# Patient Record
Sex: Male | Born: 1955 | Race: White | Hispanic: No | Marital: Single | Smoking: Never smoker
Health system: Southern US, Community
[De-identification: ages and names within clinical notes are randomized; demographics above are authoritative.]

## PROBLEM LIST (undated history)

## (undated) DIAGNOSIS — G10 Huntington's disease: Secondary | ICD-10-CM

## (undated) DIAGNOSIS — F32A Depression, unspecified: Secondary | ICD-10-CM

## (undated) DIAGNOSIS — F329 Major depressive disorder, single episode, unspecified: Secondary | ICD-10-CM

## (undated) HISTORY — DX: Depression, unspecified: F32.A

## (undated) HISTORY — PX: OTHER SURGICAL HISTORY: SHX169

## (undated) HISTORY — DX: Major depressive disorder, single episode, unspecified: F32.9

---

## 2017-06-25 ENCOUNTER — Emergency Department (HOSPITAL_COMMUNITY): Payer: Medicare Other

## 2017-06-25 ENCOUNTER — Encounter (HOSPITAL_COMMUNITY): Payer: Self-pay

## 2017-06-25 ENCOUNTER — Emergency Department (HOSPITAL_COMMUNITY)
Admission: EM | Admit: 2017-06-25 | Discharge: 2017-06-25 | Disposition: A | Discharge status: Skilled Nursing Facility | Payer: Medicare Other | Attending: Emergency Medicine | Admitting: Emergency Medicine

## 2017-06-25 DIAGNOSIS — R52 Pain, unspecified: Secondary | ICD-10-CM

## 2017-06-25 DIAGNOSIS — Y939 Activity, unspecified: Secondary | ICD-10-CM | POA: Diagnosis not present

## 2017-06-25 DIAGNOSIS — Y9210 Unspecified residential institution as the place of occurrence of the external cause: Secondary | ICD-10-CM | POA: Diagnosis not present

## 2017-06-25 DIAGNOSIS — W19XXXA Unspecified fall, initial encounter: Secondary | ICD-10-CM | POA: Diagnosis not present

## 2017-06-25 DIAGNOSIS — Y999 Unspecified external cause status: Secondary | ICD-10-CM | POA: Diagnosis not present

## 2017-06-25 DIAGNOSIS — M25531 Pain in right wrist: Secondary | ICD-10-CM | POA: Insufficient documentation

## 2017-06-25 MED ORDER — LORAZEPAM 2 MG/ML IJ SOLN
1.0000 mg | Freq: Once | INTRAMUSCULAR | Status: AC
  Administered 2017-06-25: 1 mg via INTRAMUSCULAR
  Filled 2017-06-25: qty 1

## 2017-06-25 NOTE — ED Notes (Signed)
[facility] called for transportation  

## 2017-06-25 NOTE — ED Triage Notes (Addendum)
Patient very combative and trying to get out bed. Nursing staff try to help patient stay in bed, but pt got more combative. Pt was held down by nursing staff including wendy,RN/ADN, charged nurse Patty, RN, Lucrezia Europeoyce, RN, primary nurse. M.d notified of patient situation. Patient was medicated with ativan.

## 2017-06-25 NOTE — ED Provider Notes (Signed)
[facility]-EMERGENCY DEPT Provider Note   CSN: [id number] Arrival date & time: 06/25/17  1821     History   Chief Complaint No chief complaint on file.   HPI Samuel Lambert is a 62 y.o. male.  HPI  Patient presents after a fall. Patient has multiple medical issues including involuntary movement, chronically. Reportedly the patient fell in a nursing facility, and since that time he has had some apparent discomfort about the right wrist. No reported pain in other areas, no reported substantial trauma, no reported loss of consciousness. Patient himself is oriented to self only, briefly pauses motion long enough to offer occasional brief single word responses to questions, level 5 caveat secondary to cognitive status. History provided by EMS providers. EMS providers note that the patient was interacting in a typical manner according to nursing home staff prior to departure.   Medical history  Involuntary movement disorder Cognitive impairment   Home Medications    Prior to Admission medications   Not on File   Social History Patient does not smoke, does not drink  Allergies   Patient has no allergy information on record.   Review of Systems Review of Systems  Unable to perform ROS: Other  Cognitive status   Physical Exam Updated Vital Signs Pulse (!) 135   SpO2 98%   Physical Exam  Constitutional: He has a sickly appearance. No distress.  Frail, sickly appearing male with substantial ongoing involuntary movement, requiring assistance from nursing staff for safety  HENT:  Head: Normocephalic and atraumatic.  Eyes: Conjunctivae and EOM are normal.  Cardiovascular: Normal rate and regular rhythm.  Pulmonary/Chest: Effort normal. No stridor. No respiratory distress.  Abdominal: He exhibits no distension.  Musculoskeletal: He exhibits no edema.  Diffuse atrophy, and no gross deformity of the right wrist, which the patient moves  freely. There is some minor erythema on the posterior surface, but no substantial tenderness to palpation  Neurological: He is alert. He displays atrophy and tremor. He exhibits abnormal muscle tone.  Skin: Skin is warm and dry.  Psychiatric: His mood appears anxious. He is withdrawn. Cognition and memory are impaired.  Nursing note and vitals reviewed.    ED Treatments / Results   Radiology Dg Wrist 2 Views Right  Result Date: 06/25/2017 CLINICAL DATA:  62 y/o  M; status post fall with wrist pain. EXAM: RIGHT WRIST - 2 VIEW COMPARISON:  None. FINDINGS: There is no evidence of fracture or dislocation. There is no evidence of arthropathy or other focal bone abnormality. Small bony density projects medial to the wrist joint, possibly sequelae of remote fracture. IMPRESSION: No acute fracture or dislocation identified. Electronically Signed   By: Mitzi HansenLance  Furusawa-Stratton M.D.   On: 06/25/2017 19:31    Procedures Procedures (including critical care time)  Medications Ordered in ED Medications  LORazepam (ATIVAN) injection 1 mg (1 mg Intramuscular Given 06/25/17 1839)     Initial Impression / Assessment and Plan / ED Course  I have reviewed the triage vital signs and the nursing notes.  Pertinent labs & imaging results that were available during my care of the patient were reviewed by me and considered in my medical decision making (see chart for details).    On repeat exam the patient is in similar condition, no evidence for new discomfort, he continues to have some involuntary motion, though less after receiving Ativan. No x-ray evidence for fracture, and reported consistency of his interactivity, from nursing facility staff, and EMS providers, and  with no hemodynamic instability, evidence for other acute new pathology, the patient was discharged back to his nursing facility.  Final Clinical Impressions(s) / ED Diagnoses   Final diagnoses:  Fall, initial encounter  Wrist pain,  right      Gerhard Munch, MD 06/25/17 1954

## 2017-06-25 NOTE — ED Notes (Signed)
Bed: [facility] Expected date:  Expected time:  Means of arrival:  Comments: fall 

## 2017-06-25 NOTE — ED Notes (Signed)
Patient unable to sign for discharge 

## 2017-06-25 NOTE — ED Notes (Signed)
Attempted to call [facility] x[facility] to give report. No answer each time.

## 2017-06-25 NOTE — ED Triage Notes (Signed)
Patient coming from [facility].coming in with c/o fall. Pt c/o right wrist pain.  Patient have hx of schizophrenia.

## 2017-06-25 NOTE — Discharge Instructions (Signed)
As discussed, your evaluation today has been largely reassuring.  But, it is important that you monitor your condition carefully, and do not hesitate to return to the ED if you develop new, or concerning changes in your condition. ? ?Otherwise, please follow-up with your physician for appropriate ongoing care. ? ?

## 2017-08-24 ENCOUNTER — Telehealth: Payer: Self-pay | Admitting: Neurology

## 2017-08-24 ENCOUNTER — Encounter: Payer: Self-pay | Admitting: Neurology

## 2017-08-24 ENCOUNTER — Ambulatory Visit: Payer: Self-pay | Admitting: Neurology

## 2017-08-24 NOTE — Telephone Encounter (Signed)
This patient canceled the same day of the new patient appointment today. 

## 2017-08-24 NOTE — Telephone Encounter (Signed)
FYI

## 2017-08-24 NOTE — Telephone Encounter (Signed)
Noted  

## 2017-08-24 NOTE — Telephone Encounter (Signed)
Samuel Lambert with [facility] [phone] transportation called said the pt is unable to come to the appt today. Angela/Manager got on the phone and said he came to them yesterday and he did not have any shoes and could not walk without shoes.  She said the social worker VM states she will be back 4/28. She said there is a [employer] store across the street and they will get him some things from there.  I did go over the no show policy and she and Samuel Lambert are aware this is the 1st no show, he cannot miss the next appt on 5/15. I did not look at when the appt was made but I see it was made on 2/26 with Samuel Lambert. This is conflicting information. FYI

## 2017-09-21 ENCOUNTER — Encounter: Payer: Self-pay | Admitting: Neurology

## 2017-09-21 ENCOUNTER — Ambulatory Visit (INDEPENDENT_AMBULATORY_CARE_PROVIDER_SITE_OTHER): Payer: Medicare Other | Admitting: Neurology

## 2017-09-21 DIAGNOSIS — G255 Other chorea: Secondary | ICD-10-CM | POA: Insufficient documentation

## 2017-09-21 DIAGNOSIS — G259 Extrapyramidal and movement disorder, unspecified: Secondary | ICD-10-CM | POA: Diagnosis not present

## 2017-09-21 MED ORDER — TETRABENAZINE 25 MG PO TABS
ORAL_TABLET | ORAL | 3 refills | Status: AC
Start: 2017-09-21 — End: ?

## 2017-09-21 NOTE — Progress Notes (Signed)
Reason for visit: Movement disorder  Referring physician: Dr. Jeryl Columbia is a 62 y.o. male  History of present illness:  Samuel Lambert is a 62 year old right-handed white male with an unclear past medical history.  The patient apparently was admitted to [facility] on 10 October 2016 [place].  The patient was noted to be septic with a Klebsiella species.  By history the patient has schizophrenia although the patient himself denies this.  He indicates that he has never been treated for schizophrenia, his brother has schizophrenia.  It is not clear what medications the patient was on prior to coming into the hospital.  During that hospitalization he was placed on Clozaril and the notes indicate an 80% improvement in his movement disorder on the medication.  A tardive movement disorder was suspected, although Huntington's disease was also considered.  At some point after discharge the Clozaril has been discontinued.  The patient comes in today with a caretaker who knows very little about the patient.  During the hospitalization the patient underwent a lumbar puncture, this was unremarkable.  CT scan evaluation of the brain was done and did not show any acute changes.  The patient indicates that he does not fall, he has no problems with swallowing, the movement disorder is quite severe affecting all 4 extremities.  The patient has been having difficulty maintaining his weight because of the constant moving.  He is on very low-dose Xenazine taking 12.5 mg daily.  He comes to this office for an evaluation.  The patient does note some occasional double vision, he denies headaches, he denies numbness or weakness of the extremities.  He has not had any problems controlling the bowels or the bladder.  He does have some cognitive dysfunction.  According to the caretaker his movement disorder has not worsened over time.  It is not clear  from the patient when the movements actually started.  Past Medical History:  Diagnosis Date  . Depression     Past Surgical History:  Procedure Laterality Date  . copd    . schizophrenia    . unspecified abnormal involuntary movements      History reviewed. No pertinent family history.  Social history:  reports that he has never smoked. He has never used smokeless tobacco. He reports that he does not drink alcohol or use drugs.  Medications:  Prior to Admission medications   Medication Sig Start Date End Date Taking? Authorizing Provider  acetaminophen (TYLENOL) 325 MG tablet Take 650 mg by mouth every 4 (four) hours as needed (for pain.).   Yes [provider]  bisacodyl (DULCOLAX) 5 MG EC tablet Take 10 mg by mouth daily as needed (for constipation).   Yes [provider]  famotidine (PEPCID) 20 MG tablet Take 20 mg by mouth 2 (two) times daily. (0800 & 2000)   Yes [provider]  tetrabenazine Samuel Lambert) 25 MG tablet One tablet daily for 2 weeks, then take one tablet twice a day 09/21/17   York Spaniel, MD     No Known Allergies  ROS:  Out of a complete 14 system review of symptoms, the patient complains only of the following symptoms, and all other reviewed systems are negative.  Joint pain, aching muscles  Pulse (!) 110, height  (1.905 m), SpO2 94 %.  Physical Exam  General: The patient is alert and cooperative at the time of the  examination.  Eyes: Pupils are equal, round, and reactive to light.  Discs could not be evaluated due to constant patient movement.  Neck: The neck is supple, no carotid bruits are noted.  Respiratory: The respiratory examination is clear.  Cardiovascular: The cardiovascular examination reveals a regular rate and rhythm, no obvious murmurs or rubs are noted.  Skin: Extremities are without significant edema.  Neurologic Exam  Mental status: The patient is alert and oriented x 3 at the time of the  examination.  Cranial nerves: Facial symmetry is present. There is good sensation of the face to pinprick and soft touch bilaterally. The strength of the facial muscles and the muscles to head turning and shoulder shrug are normal bilaterally. Speech is hesitant, monosyllabic. Extraocular movements are full. Visual fields are full. The tongue is midline, and the patient has symmetric elevation of the soft palate. No obvious hearing deficits are noted.  Motor: The motor testing reveals 5 over 5 strength of all 4 extremities. Good symmetric motor tone is noted throughout.  Sensory: Sensory testing is intact to pinprick, soft touch and vibration sensation on all 4 extremities. No evidence of extinction is noted.  Coordination: Cerebellar testing reveals that the patient is able to perform finger-nose-finger with some difficulty, choreoathetotic movements are constant, patient is unable to control the lower extremities well enough to perform heel-to-shin.  When the patient sits down the leg movements are extremely exaggerated and completely uncontrollable.  Gait and station: Gait is wide-based, slightly unsteady, interrupted with frequent choreoathetotic movements.  Romberg is negative but is unsteady  Reflexes: Deep tendon reflexes are symmetric and normal bilaterally. Toes are downgoing bilaterally.   Assessment/Plan:  1.  Severe choreoathetotic movement disorder  The patient very well may have Huntington's disease, the severity of the movement disorder is more than one would expect with a tardive disorder.  A good history of prior medications and date of onset of abnormal movements is not available.  Genetic testing would help exclude or confirm the diagnosis of Huntington's disease, but the treatment plan would not alter.  The patient is on [place], he is on a very low dose, this needs to be increased.  The patient will be placed on 25 mg daily for 2 weeks and then go to 25 mg twice daily.  We  will eventually work up to a 100 mg daily dose.  He will follow-up in 4 months.  The patient currently is residing at [facility].  Marlan Palau MD 09/21/2017 2:23 PM  [facility] [address] [place], [place] [address]  Phone [phone] Fax [phone]

## 2017-09-21 NOTE — Patient Instructions (Signed)
   Increase the Xenazine to 25 mg daily for 2 weeks, then take 1 tablet twice a day.

## 2017-10-31 ENCOUNTER — Other Ambulatory Visit: Payer: Self-pay

## 2017-10-31 ENCOUNTER — Encounter (HOSPITAL_COMMUNITY): Payer: Self-pay

## 2017-10-31 ENCOUNTER — Emergency Department (HOSPITAL_COMMUNITY): Payer: Medicare Other

## 2017-10-31 ENCOUNTER — Inpatient Hospital Stay (HOSPITAL_COMMUNITY)
Admission: EM | Admit: 2017-10-31 | Discharge: 2017-11-15 | DRG: 854 | Disposition: A | Discharge status: Home / Self Care | Payer: Medicare Other | Attending: Internal Medicine | Admitting: Internal Medicine

## 2017-10-31 ENCOUNTER — Inpatient Hospital Stay (HOSPITAL_COMMUNITY): Payer: Medicare Other

## 2017-10-31 DIAGNOSIS — E878 Other disorders of electrolyte and fluid balance, not elsewhere classified: Secondary | ICD-10-CM | POA: Diagnosis present

## 2017-10-31 DIAGNOSIS — E46 Unspecified protein-calorie malnutrition: Secondary | ICD-10-CM | POA: Diagnosis present

## 2017-10-31 DIAGNOSIS — F209 Schizophrenia, unspecified: Secondary | ICD-10-CM | POA: Diagnosis present

## 2017-10-31 DIAGNOSIS — E860 Dehydration: Secondary | ICD-10-CM | POA: Diagnosis present

## 2017-10-31 DIAGNOSIS — L03113 Cellulitis of right upper limb: Secondary | ICD-10-CM | POA: Diagnosis present

## 2017-10-31 DIAGNOSIS — J439 Emphysema, unspecified: Secondary | ICD-10-CM | POA: Diagnosis present

## 2017-10-31 DIAGNOSIS — G10 Huntington's disease: Secondary | ICD-10-CM

## 2017-10-31 DIAGNOSIS — R4182 Altered mental status, unspecified: Secondary | ICD-10-CM | POA: Diagnosis not present

## 2017-10-31 DIAGNOSIS — R21 Rash and other nonspecific skin eruption: Secondary | ICD-10-CM | POA: Diagnosis present

## 2017-10-31 DIAGNOSIS — E876 Hypokalemia: Secondary | ICD-10-CM | POA: Diagnosis present

## 2017-10-31 DIAGNOSIS — A419 Sepsis, unspecified organism: Secondary | ICD-10-CM | POA: Diagnosis present

## 2017-10-31 DIAGNOSIS — T8131XA Disruption of external operation (surgical) wound, not elsewhere classified, initial encounter: Secondary | ICD-10-CM | POA: Diagnosis not present

## 2017-10-31 DIAGNOSIS — F329 Major depressive disorder, single episode, unspecified: Secondary | ICD-10-CM | POA: Diagnosis present

## 2017-10-31 DIAGNOSIS — L02413 Cutaneous abscess of right upper limb: Secondary | ICD-10-CM | POA: Diagnosis not present

## 2017-10-31 DIAGNOSIS — M60821 Other myositis, right upper arm: Secondary | ICD-10-CM | POA: Diagnosis present

## 2017-10-31 DIAGNOSIS — E870 Hyperosmolality and hypernatremia: Secondary | ICD-10-CM | POA: Diagnosis present

## 2017-10-31 DIAGNOSIS — M7989 Other specified soft tissue disorders: Secondary | ICD-10-CM | POA: Diagnosis not present

## 2017-10-31 DIAGNOSIS — D649 Anemia, unspecified: Secondary | ICD-10-CM | POA: Diagnosis present

## 2017-10-31 DIAGNOSIS — R6520 Severe sepsis without septic shock: Secondary | ICD-10-CM | POA: Diagnosis present

## 2017-10-31 DIAGNOSIS — R1013 Epigastric pain: Secondary | ICD-10-CM | POA: Diagnosis present

## 2017-10-31 DIAGNOSIS — N179 Acute kidney failure, unspecified: Secondary | ICD-10-CM | POA: Diagnosis present

## 2017-10-31 DIAGNOSIS — Y658 Other specified misadventures during surgical and medical care: Secondary | ICD-10-CM | POA: Diagnosis not present

## 2017-10-31 DIAGNOSIS — G259 Extrapyramidal and movement disorder, unspecified: Secondary | ICD-10-CM | POA: Diagnosis present

## 2017-10-31 DIAGNOSIS — R000 Tachycardia, unspecified: Secondary | ICD-10-CM | POA: Diagnosis present

## 2017-10-31 DIAGNOSIS — R52 Pain, unspecified: Secondary | ICD-10-CM | POA: Diagnosis present

## 2017-10-31 DIAGNOSIS — R338 Other retention of urine: Secondary | ICD-10-CM | POA: Diagnosis not present

## 2017-10-31 DIAGNOSIS — M609 Myositis, unspecified: Secondary | ICD-10-CM | POA: Diagnosis present

## 2017-10-31 DIAGNOSIS — A4102 Sepsis due to Methicillin resistant Staphylococcus aureus: Secondary | ICD-10-CM | POA: Diagnosis not present

## 2017-10-31 HISTORY — DX: Huntington's disease: G10

## 2017-10-31 LAB — COMPREHENSIVE METABOLIC PANEL
ALK PHOS: 51 U/L (ref 38–126)
ALT: 23 U/L (ref 17–63)
ANION GAP: 15 (ref 5–15)
AST: 34 U/L (ref 15–41)
Albumin: 2.4 g/dL — ABNORMAL LOW (ref 3.5–5.0)
BUN: 41 mg/dL — ABNORMAL HIGH (ref 6–20)
CALCIUM: 8.5 mg/dL — AB (ref 8.9–10.3)
CO2: 22 mmol/L (ref 22–32)
Chloride: 112 mmol/L — ABNORMAL HIGH (ref 101–111)
Creatinine, Ser: 1.42 mg/dL — ABNORMAL HIGH (ref 0.61–1.24)
GFR calc non Af Amer: 52 mL/min — ABNORMAL LOW (ref >60)
Glucose, Bld: 144 mg/dL — ABNORMAL HIGH (ref 65–99)
Potassium: 3.7 mmol/L (ref 3.5–5.1)
SODIUM: 149 mmol/L — AB (ref 135–145)
TOTAL PROTEIN: 5.4 g/dL — AB (ref 6.5–8.1)
Total Bilirubin: 0.7 mg/dL (ref 0.3–1.2)

## 2017-10-31 LAB — CBC WITH DIFFERENTIAL/PLATELET
BASOS PCT: 0 %
Basophils Absolute: 0 10*3/uL (ref 0.0–0.1)
Eosinophils Absolute: 0 10*3/uL (ref 0.0–0.7)
Eosinophils Relative: 0 %
HCT: 43.5 % (ref 39.0–52.0)
HEMOGLOBIN: 14.6 g/dL (ref 13.0–17.0)
Lymphocytes Relative: 4 %
Lymphs Abs: 0.5 10*3/uL — ABNORMAL LOW (ref 0.7–4.0)
MCH: 31.5 pg (ref 26.0–34.0)
MCHC: 33.6 g/dL (ref 30.0–36.0)
MCV: 94 fL (ref 78.0–100.0)
Monocytes Absolute: 1.8 10*3/uL — ABNORMAL HIGH (ref 0.1–1.0)
Monocytes Relative: 17 %
NEUTROS ABS: 8.3 10*3/uL (ref 1.7–7.7)
Neutrophils Relative %: 79 %
Platelets: 417 10*3/uL — ABNORMAL HIGH (ref 150–400)
RBC: 4.63 MIL/uL (ref 4.22–5.81)
RDW: 14 % (ref 11.5–15.5)
WBC: 10.6 10*3/uL — ABNORMAL HIGH (ref 4.0–10.5)

## 2017-10-31 LAB — URINALYSIS, COMPLETE (UACMP) WITH MICROSCOPIC
Bilirubin Urine: NEGATIVE
GLUCOSE, UA: 50 mg/dL — AB
KETONES UR: NEGATIVE mg/dL
Leukocytes, UA: NEGATIVE
Nitrite: NEGATIVE
PROTEIN: 100 mg/dL — AB
Specific Gravity, Urine: 1.027 (ref 1.005–1.030)
pH: 5 (ref 5.0–8.0)

## 2017-10-31 LAB — I-STAT CG4 LACTIC ACID, ED
Lactic Acid, Venous: 3.43 mmol/L (ref 0.5–1.9)
Lactic Acid, Venous: 2.27 mmol/L (ref 0.5–1.9)

## 2017-10-31 MED ORDER — VANCOMYCIN HCL IN DEXTROSE 1-5 GM/200ML-% IV SOLN: 1000.0000 mg | Freq: Once | INTRAVENOUS | Status: DC

## 2017-10-31 MED ORDER — ACETAMINOPHEN 160 MG/5ML PO SOLN
650.0000 mg | Freq: Four times a day (QID) | ORAL | Status: DC | PRN
  Administered 2017-10-31: 650 mg via ORAL
  Filled 2017-10-31: qty 20.3

## 2017-10-31 MED ORDER — PIPERACILLIN-TAZOBACTAM 3.375 G IVPB 30 MIN: 3.3750 g | Freq: Once | INTRAVENOUS | Status: DC

## 2017-10-31 MED ORDER — PIPERACILLIN-TAZOBACTAM 3.375 G IVPB
3.3750 g | Freq: Three times a day (TID) | INTRAVENOUS | Status: DC
  Administered 2017-11-01 – 2017-11-02 (×3): 3.375 g via INTRAVENOUS
  Filled 2017-10-31 (×6): qty 50

## 2017-10-31 MED ORDER — LACTATED RINGERS IV BOLUS (SEPSIS)
500.0000 mL | Freq: Once | INTRAVENOUS | Status: AC
  Administered 2017-10-31: 500 mL via INTRAVENOUS

## 2017-10-31 MED ORDER — ONDANSETRON HCL 4 MG/2ML IJ SOLN: 4.0000 mg | Freq: Four times a day (QID) | INTRAMUSCULAR | Status: DC | PRN

## 2017-10-31 MED ORDER — ENOXAPARIN SODIUM 40 MG/0.4ML ~~LOC~~ SOLN
40.0000 mg | SUBCUTANEOUS | Status: DC
  Administered 2017-10-31 – 2017-11-03 (×4): 40 mg via SUBCUTANEOUS
  Filled 2017-10-31 (×8): qty 0.4

## 2017-10-31 MED ORDER — IOHEXOL 300 MG/ML  SOLN
75.0000 mL | Freq: Once | INTRAMUSCULAR | Status: AC | PRN
  Administered 2017-10-31: 75 mL via INTRAVENOUS

## 2017-10-31 MED ORDER — PIPERACILLIN-TAZOBACTAM 3.375 G IVPB 30 MIN
3.3750 g | Freq: Once | INTRAVENOUS | Status: AC
  Administered 2017-10-31: 3.375 g via INTRAVENOUS
  Filled 2017-10-31: qty 50

## 2017-10-31 MED ORDER — FAMOTIDINE 20 MG PO TABS
20.0000 mg | ORAL_TABLET | Freq: Two times a day (BID) | ORAL | Status: DC
  Filled 2017-10-31: qty 1

## 2017-10-31 MED ORDER — LACTATED RINGERS IV BOLUS (SEPSIS)
250.0000 mL | Freq: Once | INTRAVENOUS | Status: AC
  Administered 2017-10-31: 250 mL via INTRAVENOUS

## 2017-10-31 MED ORDER — LORAZEPAM 2 MG/ML IJ SOLN
1.0000 mg | Freq: Once | INTRAMUSCULAR | Status: AC
  Administered 2017-10-31: 1 mg via INTRAVENOUS
  Filled 2017-10-31: qty 1

## 2017-10-31 MED ORDER — ACETAMINOPHEN 325 MG PO TABS: 650.0000 mg | ORAL_TABLET | Freq: Four times a day (QID) | ORAL | Status: DC | PRN

## 2017-10-31 MED ORDER — ACETAMINOPHEN 650 MG RE SUPP
650.0000 mg | Freq: Once | RECTAL | Status: AC
  Administered 2017-10-31: 650 mg via RECTAL

## 2017-10-31 MED ORDER — ACETAMINOPHEN 325 MG PO TABS: 650.0000 mg | ORAL_TABLET | ORAL | Status: DC | PRN

## 2017-10-31 MED ORDER — ACETAMINOPHEN 650 MG RE SUPP
650.0000 mg | Freq: Four times a day (QID) | RECTAL | Status: DC | PRN
  Filled 2017-10-31: qty 1

## 2017-10-31 MED ORDER — BISACODYL 5 MG PO TBEC
10.0000 mg | DELAYED_RELEASE_TABLET | Freq: Every day | ORAL | Status: DC | PRN
Start: 2017-10-31 — End: 2017-11-15

## 2017-10-31 MED ORDER — FAMOTIDINE IN NACL 20-0.9 MG/50ML-% IV SOLN
20.0000 mg | Freq: Two times a day (BID) | INTRAVENOUS | Status: DC
  Administered 2017-10-31 – 2017-11-01 (×2): 20 mg via INTRAVENOUS
  Filled 2017-10-31 (×2): qty 50

## 2017-10-31 MED ORDER — ACETAMINOPHEN 325 MG PO TABS
650.0000 mg | ORAL_TABLET | Freq: Four times a day (QID) | ORAL | Status: DC | PRN
  Administered 2017-11-01: 650 mg via ORAL
  Filled 2017-10-31: qty 2

## 2017-10-31 MED ORDER — VANCOMYCIN HCL 10 G IV SOLR
1250.0000 mg | INTRAVENOUS | Status: DC
  Administered 2017-11-02: 1250 mg via INTRAVENOUS
  Filled 2017-10-31 (×2): qty 1250

## 2017-10-31 MED ORDER — PIPERACILLIN-TAZOBACTAM 3.375 G IVPB 30 MIN
3.3750 g | INTRAVENOUS | Status: AC
  Administered 2017-10-31: 3.375 g via INTRAVENOUS
  Filled 2017-10-31: qty 50

## 2017-10-31 MED ORDER — ACETAMINOPHEN 650 MG RE SUPP: 650.0000 mg | Freq: Four times a day (QID) | RECTAL | Status: DC | PRN

## 2017-10-31 MED ORDER — LACTATED RINGERS IV BOLUS (SEPSIS)
1000.0000 mL | Freq: Once | INTRAVENOUS | Status: AC
  Administered 2017-10-31: 1000 mL via INTRAVENOUS

## 2017-10-31 MED ORDER — HALOPERIDOL LACTATE 5 MG/ML IJ SOLN
2.5000 mg | Freq: Four times a day (QID) | INTRAMUSCULAR | Status: DC | PRN
  Administered 2017-10-31: 2.5 mg via INTRAVENOUS
  Filled 2017-10-31 (×2): qty 1

## 2017-10-31 MED ORDER — ACETAMINOPHEN 650 MG RE SUPP
650.0000 mg | RECTAL | Status: DC | PRN
  Filled 2017-10-31: qty 1

## 2017-10-31 MED ORDER — ACETAMINOPHEN 325 MG PO TABS
650.0000 mg | ORAL_TABLET | Freq: Once | ORAL | Status: AC
  Administered 2017-10-31: 650 mg via ORAL
  Filled 2017-10-31: qty 2

## 2017-10-31 MED ORDER — CITALOPRAM HYDROBROMIDE 20 MG PO TABS
10.0000 mg | ORAL_TABLET | Freq: Every day | ORAL | Status: DC
  Administered 2017-11-01 – 2017-11-15 (×15): 10 mg via ORAL
  Filled 2017-10-31 (×15): qty 1

## 2017-10-31 MED ORDER — VANCOMYCIN HCL IN DEXTROSE 1-5 GM/200ML-% IV SOLN
1000.0000 mg | Freq: Once | INTRAVENOUS | Status: AC
  Administered 2017-10-31: 1000 mg via INTRAVENOUS
  Filled 2017-10-31: qty 200

## 2017-10-31 MED ORDER — DEXTROSE-NACL 5-0.45 % IV SOLN
INTRAVENOUS | Status: DC
  Administered 2017-10-31 – 2017-11-07 (×13): via INTRAVENOUS

## 2017-10-31 MED ORDER — TETRABENAZINE 25 MG PO TABS
25.0000 mg | ORAL_TABLET | Freq: Two times a day (BID) | ORAL | Status: DC
  Administered 2017-11-02 – 2017-11-15 (×25): 25 mg via ORAL
  Filled 2017-10-31 (×3): qty 1

## 2017-10-31 MED ORDER — ONDANSETRON HCL 4 MG PO TABS
4.0000 mg | ORAL_TABLET | Freq: Four times a day (QID) | ORAL | Status: DC | PRN
  Filled 2017-10-31: qty 1

## 2017-10-31 NOTE — ED Notes (Signed)
Patient transported to CT 

## 2017-10-31 NOTE — Progress Notes (Signed)
Pharmacy Antibiotic Note  Samuel Lambert is a 62 y.o. male admitted on 10/31/2017 with cellulitis of RUE.  Pharmacy has been consulted for Vancomycin & Zosyn dosing.  Initial doses given in ED.   10/31/2017:  Febrile- Tm 100.68F  Mild leukocytosis, LA trending down after 1st doses antibiotics administered.   CT negative for osteo  AKI - Scr 1.42, baseline unknown  Using estimated weight of 50kg  Plan:  Zosyn 3.375gm IV Q8h to be infused over 4hrs  Vancomycin 1250mg  IV q48h  (target AUC 400-500)  Daily Scr  Monitor renal function and cx data   F/U actual weight     Temp (24hrs), Avg:100.8 F (38.2 C), Min:100.8 F (38.2 C), Max:100.8 F (38.2 C)  Recent Labs  Lab 10/31/17 1048 10/31/17 1101 10/31/17 1500  WBC 10.6*  --   --   CREATININE 1.42*  --   --   LATICACIDVEN  --  3.43* 2.27*    CrCl cannot be calculated (Unknown ideal weight.).    No Known Allergies  Antimicrobials this admission: 6/24 Vanc  >>  6/24 Zosyn >>   Dose adjustments this admission:  Microbiology results: 6/24 BCx:  6/24 UCx:    Thank you for allowing pharmacy to be a part of this patient's care.  Elson ClanLilliston, Samuel Lambert 10/31/2017 6:07 PM

## 2017-10-31 NOTE — ED Notes (Signed)
Writer checked on pt, all four sides rails are up, seizure pads are in place, pt is in the bed, and call bell at bedside.

## 2017-10-31 NOTE — ED Provider Notes (Signed)
Called to see patient after he fell out of the stretcher.  Patient has a history of Huntington's chorea and has pain all over.  Will obtain CT of head and cervical spine.  On exam he has no visible signs of deformities.  Will notify admitting team.   Lorre NickAllen, Cortez Flippen, MD 10/31/17 [phone]

## 2017-10-31 NOTE — ED Notes (Signed)
NT Precious assisted patient by feeding him lunch

## 2017-10-31 NOTE — H&P (Signed)
History and Physical    Samuel Lambert ZOX:[id number] DOB: [birth year] DOA: 10/31/2017  PCP: [name], PA   Patient coming from: SNF  Chief Complaint: Poor appetite and left arm pain  HPI: Samuel Lambert is a 62 y.o. male with medical history significant of Huntington's disease, who follows up with neurology clinic.  His movement disorder has been severe affecting his quality of life and overall health.  He is a resident from the nursing home.  Patient was brought to the hospital due to decreased p.o. intake, in the emergency department he was noted to have a right upper extremity rash.    On direct questioning patient reports severe pain of the right upper extremity, associated with pruritus, no improving or worsening factors, denies any other associated symptoms, symptoms present for last few days.  Unable to get a clear history due to patient's current cognitive dysfunction.   ED Course: Patient was diagnosed with sepsis, he received IV fluids and broad-spectrum IV antibiotics, CT scan of the right upper extremity with no deep infection.  Orthopedics was contacted with recommendations to admit and medically treat, if refractive to medical therapy may need further surgical consultation.   Review of Systems: Limited due to patient's difficulty speaking, and apparent cognitive dysfunction.  1. General: No fevers, no chills, no weight gain or weight loss 2. ENT: No runny nose or sore throat, no hearing disturbances 3. Pulmonary: No dyspnea, cough, wheezing, or hemoptysis 4. Cardiovascular: No angina, claudication, lower extremity edema, pnd or orthopnea 5. Gastrointestinal: No nausea or vomiting, no diarrhea or constipation 6. Hematology: No easy bruisability or frequent infections 7. Urology: No dysuria, hematuria or increased urinary frequency 8. Dermatology: Right upper extremity rash 9. Neurology: Persistent movement disorder 10. Musculoskeletal: Right upper extremity pain  Past  Medical History:  Diagnosis Date  . Depression   . Huntington's disease [facility])     Past Surgical History:  Procedure Laterality Date  . copd    . schizophrenia    . unspecified abnormal involuntary movements       reports that he has never smoked. He has never used smokeless tobacco. He reports that he does not drink alcohol or use drugs.  No Known Allergies  History reviewed. No pertinent family history.  Unable to give have a family history due to cognitive impairment  Prior to Admission medications   Medication Sig Start Date End Date Taking? Authorizing Provider  acetaminophen (TYLENOL) 325 MG tablet Take 650 mg by mouth every 4 (four) hours as needed (For pain.). Do not exceed 3 grams in 24 hours.   Yes [provider]  acetaminophen (TYLENOL) 500 MG tablet Take 1,000 mg by mouth 2 (two) times daily. Do not exceed 3 grams in 24 hours.   Yes [provider]  bisacodyl (DULCOLAX) 5 MG EC tablet Take 10 mg by mouth daily as needed (for constipation).   Yes [provider]  citalopram (CELEXA) 10 MG tablet Take 10 mg by mouth daily. 09/19/17  Yes [provider]  famotidine (PEPCID) 20 MG tablet Take 20 mg by mouth 2 (two) times daily.    Yes [provider]  tetrabenazine Guinevere Scarlet) 25 MG tablet One tablet daily for 2 weeks, then take one tablet twice a day Patient taking differently: Take 25 mg by mouth 2 (two) times daily.  09/21/17  Yes York Spaniel, MD    Physical Exam: Vitals:   10/31/17 1129 10/31/17 1254 10/31/17 1455 10/31/17 1500  BP: (!) 161/131 Marland Kitchen)  142/80 115/81 119/88  Pulse: (!) 117 96 (!) 103   Resp: (!) 25 20 (!) 26 (!) 28  Temp:      TempSrc:      SpO2: 99% 96% 97%     Constitutional: deconditioned and ill looking appearing  Vitals:   10/31/17 1129 10/31/17 1254 10/31/17 1455 10/31/17 1500  BP: (!) 161/131 (!) 142/80 115/81 119/88  Pulse: (!) 117 96 (!) 103   Resp: (!) 25 20 (!) 26 (!) 28  Temp:        TempSrc:      SpO2: 99% 96% 97%    Eyes: PERRL, lids, pale conjunctivae.  Head normocephalic, nose and ears with no deformities ENMT: Mucous membranes are dry.  Neck: normal, supple, no masses, no thyromegaly Respiratory: clear to auscultation bilaterally, no wheezing, no crackles. Normal respiratory effort. No accessory muscle use.  Cardiovascular: Regular rate and rhythm, no murmurs / rubs / gallops. No extremity edema. 2+ pedal pulses. No carotid bruits.  Abdomen: no tenderness, no masses palpated. No hepatosplenomegaly. Bowel sounds positive.  Musculoskeletal: no clubbing / cyanosis. No joint deformity upper and lower extremities. Good ROM, no contractures. Normal muscle tone.  Skin: diffuse papular rash on the right posterior hemithorax. Proximal lateral right upper extremity with edema, erythema and increase local temperature, dime size skin opening with serosanguinous drainage with no frank pus.   Neurologic: Persistent choreic movements.            Labs on Admission: I have personally reviewed following labs and imaging studies  CBC: Recent Labs  Lab 10/31/17 1048  WBC 10.6*  NEUTROABS 8.3  HGB 14.6  HCT 43.5  MCV 94.0  PLT 417*   Basic Metabolic Panel: Recent Labs  Lab 10/31/17 1048  NA 149*  K 3.7  CL 112*  CO2 22  GLUCOSE 144*  BUN 41*  CREATININE 1.42*  CALCIUM 8.5*   GFR: CrCl cannot be calculated (Unknown ideal weight.). Liver Function Tests: Recent Labs  Lab 10/31/17 1048  AST 34  ALT 23  ALKPHOS 51  BILITOT 0.7  PROT 5.4*  ALBUMIN 2.4*   No results for input(s): LIPASE, AMYLASE in the last 168 hours. No results for input(s): AMMONIA in the last 168 hours. Coagulation Profile: No results for input(s): INR, PROTIME in the last 168 hours. Cardiac Enzymes: No results for input(s): CKTOTAL, CKMB, CKMBINDEX, TROPONINI in the last 168 hours. BNP (last 3 results) No results for input(s): PROBNP in the last 8760 hours. HbA1C: No results  for input(s): HGBA1C in the last 72 hours. CBG: No results for input(s): GLUCAP in the last 168 hours. Lipid Profile: No results for input(s): CHOL, HDL, LDLCALC, TRIG, CHOLHDL, LDLDIRECT in the last 72 hours. Thyroid Function Tests: No results for input(s): TSH, T4TOTAL, FREET4, T3FREE, THYROIDAB in the last 72 hours. Anemia Panel: No results for input(s): VITAMINB12, FOLATE, FERRITIN, TIBC, IRON, RETICCTPCT in the last 72 hours. Urine analysis:    Component Value Date/Time   COLORURINE AMBER (A) 10/31/2017 1529   APPEARANCEUR CLOUDY (A) 10/31/2017 1529   LABSPEC 1.027 10/31/2017 1529   PHURINE 5.0 10/31/2017 1529   GLUCOSEU 50 (A) 10/31/2017 1529   HGBUR LARGE (A) 10/31/2017 1529   BILIRUBINUR NEGATIVE 10/31/2017 1529   KETONESUR NEGATIVE 10/31/2017 1529   PROTEINUR 100 (A) 10/31/2017 1529   NITRITE NEGATIVE 10/31/2017 1529   LEUKOCYTESUR NEGATIVE 10/31/2017 1529    Radiological Exams on Admission: Ct Extrem Up Entire Arm R W/cm  Result Date: 10/31/2017 CLINICAL DATA:  Right upper arm pain, swelling, and redness. History of Huntington's disease. EXAM: CT OF THE UPPER RIGHT EXTREMITY WITH CONTRAST TECHNIQUE: Multidetector CT imaging of the upper right extremity was performed according to the standard protocol following intravenous contrast administration. COMPARISON:  None. CONTRAST:  75mL OMNIPAQUE IOHEXOL 300 MG/ML  SOLN FINDINGS: Despite efforts by the technologist and patient, motion artifact is present on today's exam and could not be eliminated. This reduces exam sensitivity and specificity. Bones/Joint/Cartilage No acute fracture or dislocation. No cortical destruction or osteolysis. Mild degenerative changes of the glenohumeral joint. Osteopenia. Ligaments Suboptimally assessed by CT. Muscles and Tendons Ill-defined hypodensity within the triceps musculature. Small lipoma within the deltoid muscle. Remaining muscles are grossly unremarkable. The rotator cuff, biceps, triceps,  common extensor, common flexor tendons are grossly intact. Soft tissues Moderate circumferential soft tissue swelling about the upper arm. No focal fluid collection. Prominent right axillary lymph nodes are likely reactive. Severe emphysematous change in the right upper lobe with large bullae. No acute abnormality within the visualized abdomen and pelvis. 11 mm cystic skin lesion in the right lower back, likely a sebaceous cyst. IMPRESSION: 1. Moderate circumferential soft tissue swelling about the upper arm, nonspecific. Findings can be seen with cellulitis, venous insufficiency, or third spacing. No abscess. 2. Ill-defined hypodensity throughout the triceps musculature may reflect underlying myositis. 3. No CT evidence of osteomyelitis.  No acute osseous abnormality. 4.  Emphysema (ICD10-J43.9). Electronically Signed   By: Obie Dredge M.D.   On: 10/31/2017 14:46    EKG: Independently reviewed.  Significant artifact, sinus tachycardia.   Assessment/Plan Active Problems:   Sepsis (HCC)  62 year old male with significant history for Huntington's chorea who presents with a right upper extremity erythema, edema and tenderness.  His overall health condition is very poor related to his neurologic disease.  Apparently he does have malnutrition.  The history is limited due to his current cognitive dysfunction. On his initial physical examination his temperature was 100.8, blood pressure 128/100, heart rate 132, respiratory rate 25, oxygen saturation 96% on room air.  He had dry mucous membranes, his lungs were clear to auscultation bilaterally, heart S1-S2 present, tachycardic, the abdomen is soft nontender, no lower extremity edema.  Persistent choreic movements.  He does have an extensive rash on the right lateral upper extremity, positive signs of local pruritus.  Sodium 149, potassium 3.7, chloride 112, bicarb 22, glucose 144, BUN 41, creatinine 1.42, venous lactic acid 3.43, 2.27, white count 10.6,  hemoglobin 14.6, hematocrit 43.5, platelets 417.  Urinalysis specific gravity 1.027, 100 protein, 0-5 white cells.  EKG with significant artifact, sinus tachycardia.  Right upper extremity CT with moderate circumferential soft tissue swelling about the upper arm, suggestive cellulitis, no abscess.  Ill-defined hypodensity through the triceps musculature may reflect underlying myositis.  No CT evidence of osteomyelitis.  Patient will be admitted to the hospital with the working diagnosis of sepsis due to right upper extremity cellulitis/ myositis, complicated by acute kidney injury, hypernatremia and hyperchloremia.   1.  Sepsis due to right upper extremity cellulitis/myositis.  Patient will be admitted to the medical ward, will continue IV fluids with half-normal saline at 100 mg hour, antibiotic therapy with IV vancomycin and IV Zosyn.  Will follow-up on cultures, cell count and temperature curve. His choreic movements will affect telemetry monitoring.   2.  Acute kidney injury complicated by hypernatremia and hyperchloremia.  Prerenal acute kidney injury due to sepsis and dehydration.  Will continue fluid resuscitation with half normal saline and  dextrose at 100 mL/h.  Follow-up kidney function in the morning, avoid hypotension nephrotoxic agents.  Vancomycin dosing per pharmacy protocol..  3.  Huntington's chorea.  Patient in a overall poor health condition, likely he does have a very low functional capacity.  Will continue supportive medical therapy.  The caregivers need to be contacted for further advanced directives.  Continue citalopram and to tetrabenazine.   4.  Dyspepsia.  Will continue famotidine.    DVT prophylaxis: enoxaparin  Code Status:  full  Family Communication: no family at the bedside   Disposition Plan: pending clinical response  / med surg Consults called: none  Admission status: Inpatient.     Geanie Pacifico Annett Gulaaniel Dekota Shenk MD [facility] Pager [phone]  If  7PM-7AM, please contact night-coverage [email or web address] Password [email or web address]  10/31/2017, 5:12 PM

## 2017-10-31 NOTE — ED Notes (Signed)
This Clinical research associatewriter spoke with K.Scholl MD regarding the patient's condition. MD reviewed the CT scans and stated patient was safe to be transported to the 6th floor, and stated she would put in an order for a safety sitter for the patient.

## 2017-10-31 NOTE — ED Notes (Signed)
This nurse called to patient bedside by NT. Patient was lying on the floor naked, on his back, both side rails remain up and with seizure pads in place. Patient experiencing involuntary movements due to Huntingtons Disease. Patient's head and neck secured in a c-collar, and Dr Freida BusmanAllen called to bedside. Patient lifted onto bed using EMS backboard, and side rails with seizure precautions put in place. Patient Vital signs stable. Dr Freida BusmanAllen examined patient - patient complaining of increased pain to head and neck. CT scans ordered. Hospitalist Arrien paged using Amion. Awaiting response.

## 2017-10-31 NOTE — ED Notes (Signed)
Bed: WA06 Expected date:  Expected time:  Means of arrival:  Comments: EMS-FTT

## 2017-10-31 NOTE — ED Provider Notes (Signed)
[facility]-EMERGENCY DEPT Provider Note   CSN: [id number] Arrival date & time: 10/31/17  1017     History   Chief Complaint Chief Complaint  Patient presents with  . Decreased PO intake  . Wound Infection    HPI Samuel Lambert is a 62 y.o. male.  62 year old male with past medical history including Huntington's disease who presents with decreased oral intake.  The patient was brought in by EMS after his nursing facility called for decreased oral intake since yesterday.  Nursing staff also noted that he has been moving/jerking more than usual. EMS noted R arm redness and swelling but nursing facility did not mention this. PT asked for juice in route.  LEVEL 5 CAVEAT DUE TO AMS  The history is provided by the EMS personnel and the nursing home.    Past Medical History:  Diagnosis Date  . Depression   . Huntington's disease [facility](HCC)     Patient Active Problem List   Diagnosis Date Noted  . Choreoathetosis 09/21/2017    Past Surgical History:  Procedure Laterality Date  . copd    . schizophrenia    . unspecified abnormal involuntary movements          Home Medications    Prior to Admission medications   Medication Sig Start Date End Date Taking? Authorizing Provider  acetaminophen (TYLENOL) 325 MG tablet Take 650 mg by mouth every 4 (four) hours as needed (For pain.). Do not exceed 3 grams in 24 hours.   Yes [provider]  acetaminophen (TYLENOL) 500 MG tablet Take 1,000 mg by mouth 2 (two) times daily. Do not exceed 3 grams in 24 hours.   Yes [provider]  bisacodyl (DULCOLAX) 5 MG EC tablet Take 10 mg by mouth daily as needed (for constipation).   Yes [provider]  citalopram (CELEXA) 10 MG tablet Take 10 mg by mouth daily. 09/19/17  Yes [provider]  famotidine (PEPCID) 20 MG tablet Take 20 mg by mouth 2 (two) times daily.    Yes [provider]  tetrabenazine Guinevere Scarlet(XENAZINE) 25 MG tablet One  tablet daily for 2 weeks, then take one tablet twice a day Patient taking differently: Take 25 mg by mouth 2 (two) times daily.  09/21/17  Yes York SpanielWillis, Charles K, MD    Family History No family history on file.  Social History Social History   Tobacco Use  . Smoking status: Never Smoker  . Smokeless tobacco: Never Used  Substance Use Topics  . Alcohol use: Never    Frequency: Never  . Drug use: Never     Allergies   Patient has no known allergies.   Review of Systems Review of Systems  Unable to perform ROS: Mental status change     Physical Exam Updated Vital Signs BP 119/88   Pulse (!) 103   Temp (!) 100.8 F (38.2 C) (Oral)   Resp (!) 28   SpO2 97%   Physical Exam  Constitutional: He is oriented to person, place, and time. No distress.  Cachectic, chronically ill-appearing, continually moving in bed with chorea   HENT:  Head: Normocephalic and atraumatic.  dry mucous membranes  Eyes: Conjunctivae are normal.  Neck: Neck supple.  Cardiovascular: Regular rhythm and normal heart sounds. Tachycardia present.  No murmur heard. Pulmonary/Chest: Effort normal and breath sounds normal.  Abdominal: Soft. Bowel sounds are normal. He exhibits no distension. There is no tenderness.  Musculoskeletal: He exhibits edema.  Erythema, warmth, and  induration of upper R arm, no crepitus  Neurological: He is alert and oriented to person, place, and time.  States "help me" but unable to answer questions; contiuous choreoform movements  Skin: Skin is warm and dry. There is erythema.  Erythema, induration, and warmth of upper R arm with ulcerative wound draining pus on lateral proximal arm near deltoid muscle  Nursing note and vitals reviewed.    ED Treatments / Results  Labs (all labs ordered are listed, but only abnormal results are displayed) Labs Reviewed  COMPREHENSIVE METABOLIC PANEL - Abnormal; Notable for the following components:      Result Value   Sodium 149 (*)     Chloride 112 (*)    Glucose, Bld 144 (*)    BUN 41 (*)    Creatinine, Ser 1.42 (*)    Calcium 8.5 (*)    Total Protein 5.4 (*)    Albumin 2.4 (*)    GFR calc non Af Amer 52 (*)    All other components within normal limits  CBC WITH DIFFERENTIAL/PLATELET - Abnormal; Notable for the following components:   WBC 10.6 (*)    Platelets 417 (*)    Lymphs Abs 0.5 (*)    Monocytes Absolute 1.8 (*)    All other components within normal limits  I-STAT CG4 LACTIC ACID, ED - Abnormal; Notable for the following components:   Lactic Acid, Venous 3.43 (*)    All other components within normal limits  I-STAT CG4 LACTIC ACID, ED - Abnormal; Notable for the following components:   Lactic Acid, Venous 2.27 (*)    All other components within normal limits  CULTURE, BLOOD (ROUTINE X 2)  CULTURE, BLOOD (ROUTINE X 2)  URINE CULTURE  URINALYSIS, ROUTINE W REFLEX MICROSCOPIC  URINALYSIS, COMPLETE (UACMP) WITH MICROSCOPIC    EKG EKG Interpretation  Date/Time:  Monday October 31 2017 11:36:25 EDT Ventricular Rate:  135 PR Interval:    QRS Duration: 80 QT Interval:  315 QTC Calculation: 473 R Axis:   0 Text Interpretation:  Poor quality data, interpretation may be affected Sinus tachycardia Multiform ventricular premature complexes LAE, consider biatrial enlargement Indeterminate axis Low voltage, precordial leads Abnormal R-wave progression, early transition UNABLE TO READ Repol abnrm, prob ischemia, anterolateral lds Minimal ST elevation, anterolateral leads Artifact in lead(s) I II III aVR aVL aVF V1 V2 V3 V4 V5 V6 and baseline wander in lead(s) I II III aVL aVF V1 V2 V3 V4 V5 V6 Confirmed by Frederick Peers [phone]) on 10/31/2017 11:41:11 AM   Radiology Ct Extrem Up Entire Arm R W/cm  Result Date: 10/31/2017 CLINICAL DATA:  Right upper arm pain, swelling, and redness. History of Huntington's disease. EXAM: CT OF THE UPPER RIGHT EXTREMITY WITH CONTRAST TECHNIQUE: Multidetector CT imaging of the upper  right extremity was performed according to the standard protocol following intravenous contrast administration. COMPARISON:  None. CONTRAST:  75mL OMNIPAQUE IOHEXOL 300 MG/ML  SOLN FINDINGS: Despite efforts by the technologist and patient, motion artifact is present on today's exam and could not be eliminated. This reduces exam sensitivity and specificity. Bones/Joint/Cartilage No acute fracture or dislocation. No cortical destruction or osteolysis. Mild degenerative changes of the glenohumeral joint. Osteopenia. Ligaments Suboptimally assessed by CT. Muscles and Tendons Ill-defined hypodensity within the triceps musculature. Small lipoma within the deltoid muscle. Remaining muscles are grossly unremarkable. The rotator cuff, biceps, triceps, common extensor, common flexor tendons are grossly intact. Soft tissues Moderate circumferential soft tissue swelling about the upper arm. No focal fluid collection.  Prominent right axillary lymph nodes are likely reactive. Severe emphysematous change in the right upper lobe with large bullae. No acute abnormality within the visualized abdomen and pelvis. 11 mm cystic skin lesion in the right lower back, likely a sebaceous cyst. IMPRESSION: 1. Moderate circumferential soft tissue swelling about the upper arm, nonspecific. Findings can be seen with cellulitis, venous insufficiency, or third spacing. No abscess. 2. Ill-defined hypodensity throughout the triceps musculature may reflect underlying myositis. 3. No CT evidence of osteomyelitis.  No acute osseous abnormality. 4.  Emphysema (ICD10-J43.9). Electronically Signed   By: Obie Dredge M.D.   On: 10/31/2017 14:46    Procedures .Critical Care Performed by: Laurence Spates, MD Authorized by: Laurence Spates, MD   Critical care provider statement:    Critical care time (minutes):  35   Critical care time was exclusive of:  Separately billable procedures and treating other patients   Critical care was  necessary to treat or prevent imminent or life-threatening deterioration of the following conditions:  Sepsis   Critical care was time spent personally by me on the following activities:  Blood draw for specimens, development of treatment plan with patient or surrogate, evaluation of patient's response to treatment, examination of patient, obtaining history from patient or surrogate, ordering and performing treatments and interventions, ordering and review of laboratory studies, ordering and review of radiographic studies and re-evaluation of patient's condition   (including critical care time)  Medications Ordered in ED Medications  lactated ringers bolus 1,000 mL (0 mLs Intravenous Stopped 10/31/17 1152)    And  lactated ringers bolus 500 mL (0 mLs Intravenous Stopped 10/31/17 1516)    And  lactated ringers bolus 250 mL (0 mLs Intravenous Stopped 10/31/17 1159)  piperacillin-tazobactam (ZOSYN) IVPB 3.375 g (0 g Intravenous Stopped 10/31/17 1152)  vancomycin (VANCOCIN) IVPB 1000 mg/200 mL premix (0 mg Intravenous Stopped 10/31/17 1220)  acetaminophen (TYLENOL) tablet 650 mg (650 mg Oral Given 10/31/17 1121)  LORazepam (ATIVAN) injection 1 mg (1 mg Intravenous Given 10/31/17 1352)  iohexol (OMNIPAQUE) 300 MG/ML solution 75 mL (75 mLs Intravenous Contrast Given 10/31/17 1352)     Initial Impression / Assessment and Plan / ED Course  I have reviewed the triage vital signs and the nursing notes.  Pertinent labs & imaging results that were available during my care of the patient were reviewed by me and considered in my medical decision making (see chart for details).     PT ill-appearing, tachycardic, and febrile on exam.  Concerning right arm infection.  Initiated code sepsis with blood and urine cultures, IV antibiotics, IV fluids.  Initial lactate of 3.43 improved to 2.27 after fluids.  Labs show sodium 149, chloride 112, creatinine 1.42, 0.6.  Plain films show no gas in soft tissues.  I am  concerned about a deep space infection such as abscess or myositis therefore obtained CT.  CT shows findings suggestive of cellulitis, also ill-defined hypodensity in triceps muscle, possible myositis.  No evidence of osteomyelitis.  Contacted ortho, [name], who recommended continuing IV abx for now and monitoring for improvement. Ortho is available if he has clinical decline suggestive of worsening infection.  Discussed admission with [facility], Dr. Ella Jubilee, and pt admitted for further care.  Final Clinical Impressions(s) / ED Diagnoses   Final diagnoses:  None    ED Discharge Orders    None       Little, Ambrose Finland, MD 10/31/17 1711

## 2017-10-31 NOTE — Progress Notes (Signed)
Pharmacy Note   A consult was received from an ED physician for vancomycin and Zosyn per pharmacy dosing.    The patient's profile has been reviewed for ht/wt/allergies/indication/available labs.    A one time order has been placed for vancomycin 1000 mg IV x1 and Zosyn 3.375 gr IV x1 .  Further antibiotics/pharmacy consults should be ordered by admitting physician if indicated.                       Thank you,  Adalberto ColeNikola Cicley Ganesh, PharmD, BCPS Pager [phone] 10/31/2017 10:52 AM

## 2017-10-31 NOTE — ED Notes (Signed)
Dr Ilda FoilArrian called this nurse. This nurse reported the events of the fall to the MD. IV Ativan ordered with verbal order for CT scan. Will continue to monitor

## 2017-10-31 NOTE — ED Triage Notes (Signed)
Patient BIB EMS from [facility] with complaints of decreased oral intake starting yesterday. Per EMS report, ever since he was picked up by EMS, he has been asking for juice. Patient has history of Huntingtons Disease, and nursing home staff reported to EMS the patient has been "more active than normal." EMS reported noting a wound on patient right forearm. Patient Alert and talking in triage, asking for juice.

## 2017-11-01 LAB — BASIC METABOLIC PANEL
Anion gap: 8 (ref 5–15)
BUN: 40 mg/dL — ABNORMAL HIGH (ref 6–20)
CHLORIDE: 113 mmol/L — AB (ref 98–111)
CO2: 27 mmol/L (ref 22–32)
CREATININE: 1.09 mg/dL (ref 0.61–1.24)
Calcium: 8.6 mg/dL — ABNORMAL LOW (ref 8.9–10.3)
GFR calc Af Amer: >60 mL/min (ref >60)
GFR calc non Af Amer: >60 mL/min (ref >60)
Glucose, Bld: 218 mg/dL — ABNORMAL HIGH (ref 70–99)
POTASSIUM: 3.7 mmol/L (ref 3.5–5.1)
Sodium: 148 mmol/L — ABNORMAL HIGH (ref 135–145)

## 2017-11-01 LAB — CBC
HEMATOCRIT: 44.1 % (ref 39.0–52.0)
HEMOGLOBIN: 14 g/dL (ref 13.0–17.0)
MCH: 29.2 pg (ref 26.0–34.0)
MCHC: 31.7 g/dL (ref 30.0–36.0)
MCV: 91.9 fL (ref 78.0–100.0)
Platelets: 225 10*3/uL (ref 150–400)
RBC: 4.8 MIL/uL (ref 4.22–5.81)
RDW: 14.1 % (ref 11.5–15.5)
WBC: 6.2 10*3/uL (ref 4.0–10.5)

## 2017-11-01 LAB — MRSA PCR SCREENING: MRSA by PCR: POSITIVE — AB

## 2017-11-01 LAB — URINE CULTURE
CULTURE: NO GROWTH
SPECIAL REQUESTS: NORMAL

## 2017-11-01 MED ORDER — MUSCLE RUB 10-15 % EX CREA: TOPICAL_CREAM | CUTANEOUS | Status: DC | PRN

## 2017-11-01 MED ORDER — ENSURE ENLIVE PO LIQD
237.0000 mL | Freq: Three times a day (TID) | ORAL | Status: DC
  Administered 2017-11-01 – 2017-11-14 (×26): 237 mL via ORAL

## 2017-11-01 MED ORDER — ADULT MULTIVITAMIN W/MINERALS CH
1.0000 | ORAL_TABLET | Freq: Every day | ORAL | Status: DC
  Administered 2017-11-01 – 2017-11-15 (×15): 1 via ORAL
  Filled 2017-11-01 (×15): qty 1

## 2017-11-01 MED ORDER — IBUPROFEN 200 MG PO TABS
400.0000 mg | ORAL_TABLET | Freq: Four times a day (QID) | ORAL | Status: DC | PRN
  Administered 2017-11-01 – 2017-11-04 (×3): 400 mg via ORAL
  Filled 2017-11-01 (×3): qty 2

## 2017-11-01 MED ORDER — PANTOPRAZOLE SODIUM 40 MG PO TBEC
40.0000 mg | DELAYED_RELEASE_TABLET | Freq: Every day | ORAL | Status: DC
  Administered 2017-11-01 – 2017-11-15 (×15): 40 mg via ORAL
  Filled 2017-11-01 (×15): qty 1

## 2017-11-01 MED ORDER — MORPHINE SULFATE (PF) 2 MG/ML IV SOLN
1.0000 mg | INTRAVENOUS | Status: DC | PRN
  Administered 2017-11-02 – 2017-11-10 (×17): 1 mg via INTRAVENOUS
  Filled 2017-11-01 (×17): qty 1

## 2017-11-01 MED ORDER — HALOPERIDOL LACTATE 5 MG/ML IJ SOLN
1.0000 mg | Freq: Four times a day (QID) | INTRAMUSCULAR | Status: DC | PRN
  Administered 2017-11-07 – 2017-11-08 (×2): 1 mg via INTRAMUSCULAR
  Filled 2017-11-01 (×2): qty 1

## 2017-11-01 MED ORDER — HALOPERIDOL LACTATE 2 MG/ML PO CONC
1.0000 mg | Freq: Four times a day (QID) | ORAL | Status: DC | PRN
  Administered 2017-11-07: 1 mg via ORAL
  Filled 2017-11-01 (×2): qty 0.5

## 2017-11-01 NOTE — Progress Notes (Signed)
Initial Nutrition Assessment  DOCUMENTATION CODES:   Underweight  INTERVENTION:  - Will order Ensure Enlive TID, each supplement provides 350 kcal and 20 grams of protein. - Will order Magic Cup BID with meals, each supplement provides 290 kcal and 9 grams of protein. - Will order daily multivitamin with minerals.  - Sitter/tech/RN to assist with meals. - Continue to encourage PO intakes.    NUTRITION DIAGNOSIS:   Inadequate oral intake related to poor appetite as evidenced by other (comment)(report from sitter at bedside).  GOAL:   Patient will meet greater than or equal to 90% of their needs  MONITOR:   PO intake, Supplement acceptance, Weight trends, Labs  REASON FOR ASSESSMENT:   Malnutrition Screening Tool  ASSESSMENT:   62 y.o. male with medical history significant of Huntington's disease, who follows up with neurology clinic.  His movement disorder has been severe affecting his quality of life and overall health.  He is a resident from the nursing home.  Patient was brought to the hospital due to decreased p.o. intake, in the emergency department he was noted to have a right upper extremity rash.    BMI indicates underweight status. Per flow sheet, patient consumed a cup of ice cream and a bite each of grits and eggs for breakfast this AM. Patient unable to give much information at this time, does best with conversation that only requires 1-2 word response. Sitter at bedside provides all information. Patient consumed 100% of a cup of ice cream and a cup of applesauce for lunch. He did not have any difficulty or coughing with swallowing. He has been drinking liquids throughout the day and seems to prefer those over solid foods. Sitter reports that patient drank all of an Ensure yesterday and part of one today.  NFPE deferred given hx of Huntington's disease. Patient is very slender, has visually apparent wasting throughout upper and lower body but unable to determine the  extent of this that is d/t Huntington's disease or that is due to other factors. No previous weight hx available and patient unable to provide this information. Unable to obtain information about intakes PTA other than patient noting that he did not like the food at the facility.  Medications reviewed; 20 mg IV Pepcid BID. Labs reviewed; Na: 148 mmol/L, Cl: 113 mmol/L, BUN: 40 mg/dL, Ca: 8.6 mg/dL.  IVF: D5-1/2 NS @ 100 ml/hr (408 kcal).       NUTRITION - FOCUSED PHYSICAL EXAM:  Did not complete given hx of Huntington's disease.  Diet Order:   Diet Order           Diet regular Room service appropriate? Yes; Fluid consistency: Thin  Diet effective now          EDUCATION NEEDS:   No education needs have been identified at this time  Skin:  Skin Assessment: Skin Integrity Issues: Skin Integrity Issues:: Other (Comment) Other: R arm non-pressure injury  Last BM:  PTA/unknown  Height:   Ht Readings from Last 1 Encounters:  11/01/17 6\' 3"  (1.905 m)    Weight:   Wt Readings from Last 1 Encounters:  11/01/17 145 lb (65.8 kg)    Ideal Body Weight:  89.09 kg  BMI:  Body mass index is 18.12 kg/m.  Estimated Nutritional Needs:   Kcal:  1775-1975 (27-30 kcal/kg)  Protein:  75-85 grams  Fluid:  >/= 1.8 L/day     Trenton GammonJessica Denham Mose, MS, RD, LDN, CNSC Inpatient Clinical Dietitian Pager # [phone] After hours/weekend pager #  [phone]  

## 2017-11-01 NOTE — Clinical Social Work Note (Signed)
Clinical Social Work Assessment  Patient Details  Name: Samuel Lambert MRN: [id number] Date of Birth: 1956/03/03  Date of referral:  11/01/17               Reason for consult:  (pt admitted from facility)                Permission sought to share information with:  Facility Sport and exercise psychologist Permission granted to share information::  Yes, Verbal Permission Granted  Name::     [name] [phone]  Agency::  [facility]  Relationship::     Contact Information:     Housing/Transportation Living arrangements for the past 2 months:  [place] of Information:  Facility Patient Interpreter Needed:  None Criminal Activity/Legal Involvement Pertinent to Current Situation/Hospitalization:  No - Comment as needed Significant Relationships:  Warehouse manager Lives with:  Facility Resident Do you feel safe going back to the place where you live?  Yes Need for family participation in patient care:  (assessing)  Care giving concerns:  Pt admitted from ALF. Has Huntington's disease and requires total assistance and care there.  Admitted for cellulitis. Also sustained a fall while in ED- currently has sitter for safety.  Social Worker assessment / plan:  CSW consulted to assist with disposition as pt is resident of [facility] assisted living facility. Per facility has resided there since 12/2016.  Met with pt at bedside, he was pleasant and attempted to engage but due to neurocognitive and speech impairment unable to gather relevant information.  Left voicemail for pt's contact (above). Facility aware of pt's admission.   Plan: CSW will follow during hospitalization for DC needs and coordinate at DC- anticipate return to [facility].    Employment status:  Disabled (Comment on whether or not currently receiving Disability)(assessing) Insurance information:  Medicare PT Recommendations:  Not assessed at this time Information / Referral to community  resources:     Patient/Family's Response to care: UTA  Patient/Family's Understanding of and Emotional Response to Diagnosis, Current Treatment, and Prognosis:  UTA  Emotional Assessment Appearance:  Appears stated age Attitude/Demeanor/Rapport:  (pleasant) Affect (typically observed):  Calm, Happy Orientation:  Oriented to Self Alcohol / Substance use:  Not Applicable Psych involvement (Current and /or in the community):  No (Comment)  Discharge Needs  Concerns to be addressed:  Care Coordination Readmission within the last 30 days:  No Current discharge risk:  None Barriers to Discharge:  Continued Medical Work up   [employer], LCSW 11/01/2017, 3:56 PM  [phone]

## 2017-11-01 NOTE — Consult Note (Signed)
Reason for Consult: right upper arm infection, sepsis presentation Referring Physician: Ella JubileeArrien, MD  Samuel Lambert is an 62 y.o. male.  HPI: Samuel Lambert is a 62 y.o. male with medical history significant of Huntington's disease, who follows up with neurology clinic.  His movement disorder has been severe affecting his quality of life and overall health.  He is a resident from the nursing home.  Patient was brought to the hospital due to decreased p.o. intake, in the emergency department he was noted to have a right upper extremity rash.    On direct questioning patient reports severe pain of the right upper extremity, associated with pruritus, no improving or worsening factors, denies any other associated symptoms, symptoms present for last few days.  Unable to get a clear history due to patient's current cognitive dysfunction.   ED Course: Patient was diagnosed with sepsis, he received IV fluids and broad-spectrum IV antibiotics, CT scan of the right upper extremity with no deep infection.  Orthopedics was contacted with recommendations to admit and medically treat, if refractive to medical therapy may need further surgical consultation.   Presentation reviewed as above and confirmed with patient in room. No reported trauma burns or otherwise    Past Medical History:  Diagnosis Date  . Depression   . Huntington's disease [facility](HCC)     Past Surgical History:  Procedure Laterality Date  . copd    . schizophrenia    . unspecified abnormal involuntary movements      History reviewed. No pertinent family history.  Social History:  reports that he has never smoked. He has never used smokeless tobacco. He reports that he does not drink alcohol or use drugs.  Allergies: No Known Allergies  Medications:  I have reviewed the patient's current medications. Scheduled: . citalopram  10 mg Oral Daily  . enoxaparin (LOVENOX) injection  40 mg Subcutaneous Q24H  . tetrabenazine  25 mg Oral  BID    Results for orders placed or performed during the hospital encounter of 10/31/17 (from the past 24 hour(s))  Comprehensive metabolic panel     Status: Abnormal   Collection Time: 10/31/17 10:48 AM  Result Value Ref Range   Sodium 149 (H) 135 - 145 mmol/L   Potassium 3.7 3.5 - 5.1 mmol/L   Chloride 112 (H) 101 - 111 mmol/L   CO2 22 22 - 32 mmol/L   Glucose, Bld 144 (H) 65 - 99 mg/dL   BUN 41 (H) 6 - 20 mg/dL   Creatinine, Ser 1.611.42 (H) 0.61 - 1.24 mg/dL   Calcium 8.5 (L) 8.9 - 10.3 mg/dL   Total Protein 5.4 (L) 6.5 - 8.1 g/dL   Albumin 2.4 (L) 3.5 - 5.0 g/dL   AST 34 15 - 41 U/L   ALT 23 17 - 63 U/L   Alkaline Phosphatase 51 38 - 126 U/L   Total Bilirubin 0.7 0.3 - 1.2 mg/dL   GFR calc non Af Amer 52 (L) >60 mL/min   GFR calc Af Amer >60 >60 mL/min   Anion gap 15 5 - 15  CBC WITH DIFFERENTIAL     Status: Abnormal   Collection Time: 10/31/17 10:48 AM  Result Value Ref Range   WBC 10.6 (H) 4.0 - 10.5 K/uL   RBC 4.63 4.22 - 5.81 MIL/uL   Hemoglobin 14.6 13.0 - 17.0 g/dL   HCT 09.643.5 04.539.0 - 40.952.0 %   MCV 94.0 78.0 - 100.0 fL   MCH 31.5 26.0 - 34.0 pg  MCHC 33.6 30.0 - 36.0 g/dL   RDW 16.1 09.6 - 04.5 %   Platelets 417 (H) 150 - 400 K/uL   Neutrophils Relative % 79 %   Neutro Abs 8.3 1.7 - 7.7 K/uL   Lymphocytes Relative 4 %   Lymphs Abs 0.5 (L) 0.7 - 4.0 K/uL   Monocytes Relative 17 %   Monocytes Absolute 1.8 (H) 0.1 - 1.0 K/uL   Eosinophils Relative 0 %   Eosinophils Absolute 0.0 0.0 - 0.7 K/uL   Basophils Relative 0 %   Basophils Absolute 0.0 0.0 - 0.1 K/uL   WBC Morphology DOHLE BODIES   I-Stat CG4 Lactic Acid, ED  (not at  [facility])     Status: Abnormal   Collection Time: 10/31/17 11:01 AM  Result Value Ref Range   Lactic Acid, Venous 3.43 (HH) 0.5 - 1.9 mmol/L   Comment NOTIFIED PHYSICIAN   I-Stat CG4 Lactic Acid, ED  (not at  [facility])     Status: Abnormal   Collection Time: 10/31/17  3:00 PM  Result Value Ref Range   Lactic Acid, Venous 2.27 (HH) 0.5 - 1.9 mmol/L    Comment NOTIFIED PHYSICIAN   Urinalysis, Complete w Microscopic     Status: Abnormal   Collection Time: 10/31/17  3:29 PM  Result Value Ref Range   Color, Urine AMBER (A) YELLOW   APPearance CLOUDY (A) CLEAR   Specific Gravity, Urine 1.027 1.005 - 1.030   pH 5.0 5.0 - 8.0   Glucose, UA 50 (A) NEGATIVE mg/dL   Hgb urine dipstick LARGE (A) NEGATIVE   Bilirubin Urine NEGATIVE NEGATIVE   Ketones, ur NEGATIVE NEGATIVE mg/dL   Protein, ur 409 (A) NEGATIVE mg/dL   Nitrite NEGATIVE NEGATIVE   Leukocytes, UA NEGATIVE NEGATIVE   RBC / HPF 0-5 0 - 5 RBC/hpf   WBC, UA 0-5 0 - 5 WBC/hpf   Bacteria, UA RARE (A) NONE SEEN   Squamous Epithelial / LPF 0-5 0 - 5   Mucus PRESENT   Basic metabolic panel     Status: Abnormal   Collection Time: 11/01/17  4:17 AM  Result Value Ref Range   Sodium 148 (H) 135 - 145 mmol/L   Potassium 3.7 3.5 - 5.1 mmol/L   Chloride 113 (H) 98 - 111 mmol/L   CO2 27 22 - 32 mmol/L   Glucose, Bld 218 (H) 70 - 99 mg/dL   BUN 40 (H) 6 - 20 mg/dL   Creatinine, Ser 8.11 0.61 - 1.24 mg/dL   Calcium 8.6 (L) 8.9 - 10.3 mg/dL   GFR calc non Af Amer >60 >60 mL/min   GFR calc Af Amer >60 >60 mL/min   Anion gap 8 5 - 15  CBC     Status: None   Collection Time: 11/01/17  4:17 AM  Result Value Ref Range   WBC 6.2 4.0 - 10.5 K/uL   RBC 4.80 4.22 - 5.81 MIL/uL   Hemoglobin 14.0 13.0 - 17.0 g/dL   HCT 91.4 78.2 - 95.6 %   MCV 91.9 78.0 - 100.0 fL   MCH 29.2 26.0 - 34.0 pg   MCHC 31.7 30.0 - 36.0 g/dL   RDW 21.3 08.6 - 57.8 %   Platelets 225 150 - 400 K/uL    X-ray: CLINICAL DATA:  Right upper arm pain, swelling, and redness. History of Huntington's disease.  EXAM: CT OF THE UPPER RIGHT EXTREMITY WITH CONTRAST  TECHNIQUE: Multidetector CT imaging of the upper right extremity was performed according  to the standard protocol following intravenous contrast administration.  COMPARISON:  None.  CONTRAST:  75mL OMNIPAQUE IOHEXOL 300 MG/ML   SOLN  FINDINGS: Despite efforts by the technologist and patient, motion artifact is present on today's exam and could not be eliminated. This reduces exam sensitivity and specificity.  Bones/Joint/Cartilage  No acute fracture or dislocation. No cortical destruction or osteolysis. Mild degenerative changes of the glenohumeral joint. Osteopenia.  Ligaments  Suboptimally assessed by CT.  Muscles and Tendons  Ill-defined hypodensity within the triceps musculature. Small lipoma within the deltoid muscle. Remaining muscles are grossly unremarkable. The rotator cuff, biceps, triceps, common extensor, common flexor tendons are grossly intact.  Soft tissues  Moderate circumferential soft tissue swelling about the upper arm. No focal fluid collection. Prominent right axillary lymph nodes are likely reactive.  Severe emphysematous change in the right upper lobe with large bullae. No acute abnormality within the visualized abdomen and pelvis. 11 mm cystic skin lesion in the right lower back, likely a sebaceous cyst.  IMPRESSION: 1. Moderate circumferential soft tissue swelling about the upper arm, nonspecific. Findings can be seen with cellulitis, venous insufficiency, or third spacing. No abscess. 2. Ill-defined hypodensity throughout the triceps musculature may reflect underlying myositis. 3. No CT evidence of osteomyelitis.  No acute osseous abnormality. 4.  Emphysema (ICD10-J43.9).  ROS As noted per admitting HPI for right arm and medical co-morbidities  Blood pressure 121/75, pulse 90, temperature 98.5 F (36.9 C), temperature source Axillary, resp. rate 20, SpO2 98 %.  Physical Exam  Curled up in bed related to underlying Huntington's disease Awake alert cooperative  Right upper arm ,1cm wound with serous ooze present No foul smell Right arm swelling and erythema, tender No fluctuance appreciated   Assessment/Plan: Right upper arm cellulitis with  underlying edema and myositis by CT scan  Plan: Given no fluctuance or abscess by CT I would recommend IV antibiotics for now - no surgery Will need to observe response to antibiotics   Shelda Pal 11/01/2017, 7:26 AM

## 2017-11-01 NOTE — Progress Notes (Signed)
PROGRESS NOTE    Samuel Lambert  JXB:[id number] DOB: 05/19/1955 DOA: 10/31/2017 PCP: Mortimer Friesurl, [place], PA    Brief Narrative:  62 year old male with significant history for Huntington's chorea who presents with a right upper extremity erythema, edema and tenderness.  His overall health condition is very poor related to his neurologic disease.  Apparently he does have malnutrition.  The history is limited due to his current cognitive dysfunction. On his initial physical examination his temperature was 100.8, blood pressure 128/100, heart rate 132, respiratory rate 25, oxygen saturation 96% on room air.  He had dry mucous membranes, his lungs were clear to auscultation bilaterally, heart S1-S2 present, tachycardic, the abdomen is soft nontender, no lower extremity edema.  Persistent choreic movements.  He does have an extensive rash on the right lateral upper extremity, positive signs of local pruritus.  Sodium 149, potassium 3.7, chloride 112, bicarb 22, glucose 144, BUN 41, creatinine 1.42, venous lactic acid 3.43, 2.27, white count 10.6, hemoglobin 14.6, hematocrit 43.5, platelets 417.  Urinalysis specific gravity 1.027, 100 protein, 0-5 white cells.  EKG with significant artifact, sinus tachycardia.  Right upper extremity CT with moderate circumferential soft tissue swelling about the upper arm, suggestive cellulitis, no abscess.  Ill-defined hypodensity through the triceps musculature may reflect underlying myositis.  No CT evidence of osteomyelitis.  Patient will be admitted to the hospital with the working diagnosis of sepsis due to right upper extremity cellulitis/ myositis, complicated by acute kidney injury, hypernatremia and hyperchloremia.    Assessment & Plan:   Active Problems:   Sepsis (HCC)   1. Sepsis due to right upper extremity cellulitis/ myositis. Persistent and extensive rash, will continue broad spectrum antibiotic therapy with IV vancomycin and IV zosyn, will follow on cultures,  cell count and temperature curve. Heart rate and respiratory rate have improved, patient has been afebrile. Will decrease rate of IV fluids, follow a restrictive IV fluids strategy.   2. Pre-renal AKI due to sepsis, complicated with hypernatremia and hyperchloremia. Will continue hydration with half normal saline, renal function with improved cr down to 1,09, with K at 3,7 and cl at 113, Na at 148 from 149. Will continue to follow on renal panel in am, avoid hypotension or nephrotoxic medications, vancomycin levels per pharmacy protocol.   3. Huntington's chorea. More calm today, will continue sitter at the bedside, fall precautions, CT neck negative for acute changes, will remove C collar. Continue pain control with morphine and ibuprofen. In the process to get tetrabenazine per pharmacy. Will change haldol as needed IM or PO.    4. Calorie protein malnutrition. Will continue nutritional supplements and vitamins, per dietary recommendations.   5. Depression. Continue citalopram.   DVT prophylaxis: enoxaparin   Code Status:  full Family Communication: no family at the bedside  Disposition Plan: snf, when clinically improved   Consultants:   Orthopedics  Procedures:     Antimicrobials:       Subjective: Patient complained of pain on his right upper extremity, worse to touch or movement, no associated nausea or vomiting, no radiation, has been constant.   Objective: Vitals:   11/01/17 1420 11/01/17 1425 11/01/17 1435 11/01/17 1438  BP: 140/73  129/77   Pulse: 91  94   Resp:   14   Temp:    99.3 F (37.4 C)  TempSrc:    Oral  SpO2: 97%  98%   Weight:  65.8 kg (145 lb)    Height:  6\' 3"  (1.905 m)  Intake/Output Summary (Last 24 hours) at 11/01/2017 1544 Last data filed at 11/01/2017 0930 Gross per 24 hour  Intake 1170 ml  Output 650 ml  Net 520 ml   Filed Weights   11/01/17 1425  Weight: 65.8 kg (145 lb)    Examination:   General: deconditioned and ill  looking appearing Neurology: Awake and alert, non focal  E ENT: mild pallor, no icterus, oral mucosa moist Cardiovascular: No JVD. S1-S2 present, rhythmic, no gallops, rubs, or murmurs. No lower extremity edema. Pulmonary: decreased breath sounds bilaterally, adequate air movement, no wheezing, rhonchi or rales. Gastrointestinal. Abdomen with no organomegaly, non tender, no rebound or guarding Skin. Right arm with significant proximal edema with erythema and local tenderness.   Musculoskeletal: no joint deformities     Data Reviewed: I have personally reviewed following labs and imaging studies  CBC: Recent Labs  Lab 10/31/17 1048 11/01/17 0417  WBC 10.6* 6.2  NEUTROABS 8.3  --   HGB 14.6 14.0  HCT 43.5 44.1  MCV 94.0 91.9  PLT 417* 225   Basic Metabolic Panel: Recent Labs  Lab 10/31/17 1048 11/01/17 0417  NA 149* 148*  K 3.7 3.7  CL 112* 113*  CO2 22 27  GLUCOSE 144* 218*  BUN 41* 40*  CREATININE 1.42* 1.09  CALCIUM 8.5* 8.6*   GFR: Estimated Creatinine Clearance: 67.1 mL/min (by C-G formula based on SCr of 1.09 mg/dL). Liver Function Tests: Recent Labs  Lab 10/31/17 1048  AST 34  ALT 23  ALKPHOS 51  BILITOT 0.7  PROT 5.4*  ALBUMIN 2.4*   No results for input(s): LIPASE, AMYLASE in the last 168 hours. No results for input(s): AMMONIA in the last 168 hours. Coagulation Profile: No results for input(s): INR, PROTIME in the last 168 hours. Cardiac Enzymes: No results for input(s): CKTOTAL, CKMB, CKMBINDEX, TROPONINI in the last 168 hours. BNP (last 3 results) No results for input(s): PROBNP in the last 8760 hours. HbA1C: No results for input(s): HGBA1C in the last 72 hours. CBG: No results for input(s): GLUCAP in the last 168 hours. Lipid Profile: No results for input(s): CHOL, HDL, LDLCALC, TRIG, CHOLHDL, LDLDIRECT in the last 72 hours. Thyroid Function Tests: No results for input(s): TSH, T4TOTAL, FREET4, T3FREE, THYROIDAB in the last 72  hours. Anemia Panel: No results for input(s): VITAMINB12, FOLATE, FERRITIN, TIBC, IRON, RETICCTPCT in the last 72 hours.    Radiology Studies: I have reviewed all of the imaging during this hospital visit personally     Scheduled Meds: . citalopram  10 mg Oral Daily  . enoxaparin (LOVENOX) injection  40 mg Subcutaneous Q24H  . feeding supplement (ENSURE ENLIVE)  237 mL Oral TID BM  . multivitamin with minerals  1 tablet Oral Daily  . tetrabenazine  25 mg Oral BID   Continuous Infusions: . dextrose 5 % and 0.45% NaCl 100 mL/hr at 11/01/17 1142  . famotidine (PEPCID) IV Stopped (11/01/17 1205)  . piperacillin-tazobactam (ZOSYN)  IV 3.375 g (11/01/17 1425)  . vancomycin       LOS: 1 day        Coralie Keens, MD [facility] Pager [phone]

## 2017-11-02 ENCOUNTER — Inpatient Hospital Stay (HOSPITAL_COMMUNITY): Payer: Medicare Other

## 2017-11-02 DIAGNOSIS — M7989 Other specified soft tissue disorders: Secondary | ICD-10-CM

## 2017-11-02 DIAGNOSIS — D649 Anemia, unspecified: Secondary | ICD-10-CM

## 2017-11-02 DIAGNOSIS — E878 Other disorders of electrolyte and fluid balance, not elsewhere classified: Secondary | ICD-10-CM

## 2017-11-02 LAB — CBC WITH DIFFERENTIAL/PLATELET
BASOS PCT: 0 %
Basophils Absolute: 0 10*3/uL (ref 0.0–0.1)
EOS ABS: 0 10*3/uL (ref 0.0–0.7)
Eosinophils Relative: 0 %
HCT: 33.1 % — ABNORMAL LOW (ref 39.0–52.0)
HEMOGLOBIN: 10.7 g/dL — AB (ref 13.0–17.0)
LYMPHS PCT: 7 %
Lymphs Abs: 0.5 10*3/uL — ABNORMAL LOW (ref 0.7–4.0)
MCH: 29.7 pg (ref 26.0–34.0)
MCHC: 32.3 g/dL (ref 30.0–36.0)
MCV: 91.9 fL (ref 78.0–100.0)
Monocytes Absolute: 0.6 10*3/uL (ref 0.1–1.0)
Monocytes Relative: 9 %
Neutro Abs: 6.1 10*3/uL (ref 1.7–7.7)
Neutrophils Relative %: 84 %
Platelets: 219 10*3/uL (ref 150–400)
RBC: 3.6 MIL/uL — AB (ref 4.22–5.81)
RDW: 14.3 % (ref 11.5–15.5)
WBC: 7.2 10*3/uL (ref 4.0–10.5)

## 2017-11-02 LAB — HIV ANTIBODY (ROUTINE TESTING W REFLEX): HIV SCREEN 4TH GENERATION: NONREACTIVE

## 2017-11-02 LAB — BASIC METABOLIC PANEL
Anion gap: 9 (ref 5–15)
BUN: 28 mg/dL — AB (ref 6–20)
CHLORIDE: 108 mmol/L (ref 98–111)
CO2: 28 mmol/L (ref 22–32)
Calcium: 8.4 mg/dL — ABNORMAL LOW (ref 8.9–10.3)
Creatinine, Ser: 0.84 mg/dL (ref 0.61–1.24)
Glucose, Bld: 201 mg/dL — ABNORMAL HIGH (ref 70–99)
POTASSIUM: 3.4 mmol/L — AB (ref 3.5–5.1)
SODIUM: 145 mmol/L (ref 135–145)

## 2017-11-02 MED ORDER — SODIUM CHLORIDE 0.9 % IV SOLN
1.0000 g | INTRAVENOUS | Status: DC
  Administered 2017-11-02 – 2017-11-08 (×7): 1 g via INTRAVENOUS
  Filled 2017-11-02 (×3): qty 1
  Filled 2017-11-02: qty 10
  Filled 2017-11-02 (×4): qty 1

## 2017-11-02 MED ORDER — LIDOCAINE HCL URETHRAL/MUCOSAL 2 % EX GEL
1.0000 "application " | Freq: Once | CUTANEOUS | Status: DC
  Filled 2017-11-02: qty 5

## 2017-11-02 MED ORDER — LIDOCAINE HCL URETHRAL/MUCOSAL 2 % EX GEL
1.0000 "application " | Freq: Once | CUTANEOUS | Status: AC
  Administered 2017-11-02: 1 via TOPICAL
  Filled 2017-11-02: qty 5

## 2017-11-02 MED ORDER — POTASSIUM CHLORIDE CRYS ER 20 MEQ PO TBCR
40.0000 meq | EXTENDED_RELEASE_TABLET | Freq: Once | ORAL | Status: AC
  Administered 2017-11-02: 40 meq via ORAL
  Filled 2017-11-02: qty 2

## 2017-11-02 NOTE — Progress Notes (Signed)
Pt having difficulty urinating at this time. Bladder scan completed 395 cc residual, pt unable to tolerate in and out cath, paged Natalia LeatherwoodKatherine Np order given to place a coude cath at this time, I will continue  to monitor.

## 2017-11-02 NOTE — Progress Notes (Signed)
This RN was notified by Barkley BrunsKristine, RN that patient needed a coude catheter placed. A timeout and peri care was performed. Attempted to place 16 Fr coude catheter accompanied by the patients nurse and other staff but was unsuccessful. Patient tolerated attempt fair.

## 2017-11-02 NOTE — Progress Notes (Signed)
Patient ID: Samuel Lambert, male   DOB: 05/15/1955, 62 y.o.   MRN: [id number]  Pain with movement, re-positioning by report from nursing  Right upper arm with persistent erythema, swelling Wound dressed No fluctuance palpable   Right upper extremity with cellulitis and underlying myositis  Continue IV antibiotics until erythema and swelling reduce then transition to PO antibiotics I had a chance to review this case with Upper Extremity surgeon who felt that plan was appropriate.  I will follow while hospitalized for change

## 2017-11-02 NOTE — Progress Notes (Signed)
*  Preliminary Results* Right upper extremity venous duplex completed. Right upper extremity is negative for deep and superficial vein thrombosis.  11/02/2017 3:01 PM  Gertie FeyMichelle Johnwesley Lederman, BS, RVT, RDCS, RDMS

## 2017-11-02 NOTE — Progress Notes (Signed)
PROGRESS NOTE    Samuel Lambert  ZYS:[id number] DOB: January 22, 1956 DOA: 10/31/2017 PCP: Samuel Fries, PA   Brief Narrative:  HPI on 11/01/2017 by Dr. Delrae Lambert Samuel Lambert is a 62 y.o. male with medical history significant of Huntington's disease, who follows up with neurology clinic.  His movement disorder has been severe affecting his quality of life and overall health.  He is a resident from the nursing home.  Patient was brought to the hospital due to decreased p.o. intake, in the emergency department he was noted to have a right upper extremity rash.    On direct questioning patient reports severe pain of the right upper extremity, associated with pruritus, no improving or worsening factors, denies any other associated symptoms, symptoms present for last few days.  Unable to get a clear history due to patient's current cognitive dysfunction.   Interim history  Admitted for sepsis secondary to right upper extremity cellulitis and myositis, placed on IV vancomycin and Zosyn.  Assessment & Plan   Sepsis secondary to right upper extreme the cellulitis/myositis -Patient presented with tachycardia, tachypnea, elevated lactic acid -CT right upper extremity: Moderate circumferential soft tissue swelling about the upper arm, can be seen with cellulitis, venous insufficiency or third spacing.  No abscess.  Ill-defined hypodensity throughout the triceps musculature, may reflect underlying myositis.  No osseous abnormality. -Blood cultures show no growth to date -Placed on IV Zosyn and vancomycin -Orthopedic surgery consulted and appreciated-commended continuing IV antibiotics for now, no surgery. -Given slow response to antibiotics, will obtain upper extremity ultrasound  Acute kidney injury complicated by hypernatremia and hyperchloremia -Resolved -Suspect secondary to sepsis and dehydration, prerenal causes -Creatinine 1.42 on admission, currently 0.84 -Placed on IV fluids -Continue to  monitor BMP  Huntington's chorea -Patient does have low functional capacity and is an overall poor health -Continue supportive medical care -Continue citalopram and tetrabenazine  Dyspepsia -Continue famotidine  Normocytic anemia -hemoglobin 10.7, drop from 14 (suspect delusional component as patient is receiving IV fluids) -No previous hemoglobin for comparison -continue to monitor CC  Protein calorie malnutrition -Nutrition consulted, continue supplements  DVT Prophylaxis  Lovenox  Code Status: Full  Family Communication: None at bedside  Disposition Plan: Admitted. Pending improvement  Consultants Orthopedic surgery  Procedures  None  Antibiotics   Anti-infectives (From admission, onward)   Start     Dose/Rate Route Frequency Ordered Stop   11/02/17 1200  cefTRIAXone (ROCEPHIN) 1 g in sodium chloride 0.9 % 100 mL IVPB     1 g 200 mL/hr over 30 Minutes Intravenous Every 24 hours 11/02/17 0911     11/01/17 2200  vancomycin (VANCOCIN) 1,250 mg in sodium chloride 0.9 % 250 mL IVPB  Status:  Discontinued     1,250 mg 166.7 mL/hr over 90 Minutes Intravenous Every 48 hours 10/31/17 2142 11/02/17 0911   11/01/17 0400  piperacillin-tazobactam (ZOSYN) IVPB 3.375 g  Status:  Discontinued     3.375 g 12.5 mL/hr over 240 Minutes Intravenous Every 8 hours 10/31/17 2142 11/02/17 0911   10/31/17 2130  piperacillin-tazobactam (ZOSYN) IVPB 3.375 g  Status:  Discontinued     3.375 g 100 mL/hr over 30 Minutes Intravenous  Once 10/31/17 2115 10/31/17 2115   10/31/17 2130  vancomycin (VANCOCIN) IVPB 1000 mg/200 mL premix  Status:  Discontinued     1,000 mg 200 mL/hr over 60 Minutes Intravenous  Once 10/31/17 2115 10/31/17 2115   10/31/17 1815  piperacillin-tazobactam (ZOSYN) IVPB 3.375 g     3.375 g  100 mL/hr over 30 Minutes Intravenous NOW 10/31/17 1812 10/31/17 2350   10/31/17 1100  piperacillin-tazobactam (ZOSYN) IVPB 3.375 g     3.375 g 100 mL/hr over 30 Minutes Intravenous   Once 10/31/17 1049 10/31/17 1152   10/31/17 1100  vancomycin (VANCOCIN) IVPB 1000 mg/200 mL premix     1,000 mg 200 mL/hr over 60 Minutes Intravenous  Once 10/31/17 1049 10/31/17 1220      Subjective:   Samuel Lambert seen and examined today.  Continues to complain of right arm pain.  Denies chest pain, shortness of breath.  Objective:   Vitals:   11/01/17 1435 11/01/17 1438 11/01/17 2105 11/02/17 0527  BP: 129/77  100/65 109/69  Pulse: 94  72 84  Resp: 14  20 20   Temp:  99.3 F (37.4 C) 97.7 F (36.5 C) 98.9 F (37.2 C)  TempSrc:  Oral Oral Oral  SpO2: 98%  98% 97%  Weight:      Height:        Intake/Output Summary (Last 24 hours) at 11/02/2017 1252 Last data filed at 11/02/2017 1200 Gross per 24 hour  Intake 2440 ml  Output 400 ml  Net 2040 ml   Filed Weights   11/01/17 1425  Weight: 65.8 kg (145 lb)    Exam  General: Well developed, chronically ill appearing, NAD  HEENT: NCAT,  mucous membranes moist.   Neck: Supple  Cardiovascular: S1 S2 auscultated, no murmurs, RRR  Respiratory: Diminished breath sounds, no wheezing   Abdomen: Soft, nontender, nondistended, + bowel sounds  Extremities: warm dry without cyanosis clubbing or edema  Neuro: Awake and alert, hard of hearing, moves all extremities   Skin: Without rashes exudates or nodules  Psych: Normal affect and demeanor with intact judgement and insight   Data Reviewed: I have personally reviewed following labs and imaging studies  CBC: Recent Labs  Lab 10/31/17 1048 11/01/17 0417 11/02/17 0358  WBC 10.6* 6.2 7.2  NEUTROABS 8.3  --  6.1  HGB 14.6 14.0 10.7*  HCT 43.5 44.1 33.1*  MCV 94.0 91.9 91.9  PLT 417* 225 219   Basic Metabolic Panel: Recent Labs  Lab 10/31/17 1048 11/01/17 0417 11/02/17 0358  NA 149* 148* 145  K 3.7 3.7 3.4*  CL 112* 113* 108  CO2 22 27 28   GLUCOSE 144* 218* 201*  BUN 41* 40* 28*  CREATININE 1.42* 1.09 0.84  CALCIUM 8.5* 8.6* 8.4*   GFR: Estimated  Creatinine Clearance: 87 mL/min (by C-G formula based on SCr of 0.84 mg/dL). Liver Function Tests: Recent Labs  Lab 10/31/17 1048  AST 34  ALT 23  ALKPHOS 51  BILITOT 0.7  PROT 5.4*  ALBUMIN 2.4*   No results for input(s): LIPASE, AMYLASE in the last 168 hours. No results for input(s): AMMONIA in the last 168 hours. Coagulation Profile: No results for input(s): INR, PROTIME in the last 168 hours. Cardiac Enzymes: No results for input(s): CKTOTAL, CKMB, CKMBINDEX, TROPONINI in the last 168 hours. BNP (last 3 results) No results for input(s): PROBNP in the last 8760 hours. HbA1C: No results for input(s): HGBA1C in the last 72 hours. CBG: No results for input(s): GLUCAP in the last 168 hours. Lipid Profile: No results for input(s): CHOL, HDL, LDLCALC, TRIG, CHOLHDL, LDLDIRECT in the last 72 hours. Thyroid Function Tests: No results for input(s): TSH, T4TOTAL, FREET4, T3FREE, THYROIDAB in the last 72 hours. Anemia Panel: No results for input(s): VITAMINB12, FOLATE, FERRITIN, TIBC, IRON, RETICCTPCT in the last 72 hours. Urine  analysis:    Component Value Date/Time   COLORURINE AMBER (A) 10/31/2017 1529   APPEARANCEUR CLOUDY (A) 10/31/2017 1529   LABSPEC 1.027 10/31/2017 1529   PHURINE 5.0 10/31/2017 1529   GLUCOSEU 50 (A) 10/31/2017 1529   HGBUR LARGE (A) 10/31/2017 1529   BILIRUBINUR NEGATIVE 10/31/2017 1529   KETONESUR NEGATIVE 10/31/2017 1529   PROTEINUR 100 (A) 10/31/2017 1529   NITRITE NEGATIVE 10/31/2017 1529   LEUKOCYTESUR NEGATIVE 10/31/2017 1529   Sepsis Labs: @LABRCNTIP (procalcitonin:4,lacticidven:4)  ) Recent Results (from the past 240 hour(s))  Blood Culture (routine x 2)     Status: None (Preliminary result)   Collection Time: 10/31/17 10:48 AM  Result Value Ref Range Status   Specimen Description   Final    BLOOD RIGHT ANTECUBITAL Performed at [facility], [address] [place], [place] [address]    Special Requests   Final     BOTTLES DRAWN AEROBIC AND ANAEROBIC Blood Culture adequate volume Performed at [facility], [address], [place], [place] [address]    Culture   Final    NO GROWTH < 24 HOURS Performed at [facility], [address], [place], [place] [address]    Report Status PENDING  Incomplete  Blood Culture (routine x 2)     Status: None (Preliminary result)   Collection Time: 10/31/17 10:53 AM  Result Value Ref Range Status   Specimen Description   Final    BLOOD LEFT FOREARM Performed at [facility], [address], [place], [place] [address]    Special Requests   Final    BOTTLES DRAWN AEROBIC AND ANAEROBIC Blood Culture adequate volume Performed at [facility], [address] [place], [place] [address]    Culture   Final    NO GROWTH < 24 HOURS Performed at [facility], [address] [place], [place] [address]    Report Status PENDING  Incomplete  Urine culture     Status: None   Collection Time: 10/31/17  3:29 PM  Result Value Ref Range Status   Specimen Description   Final    URINE, CATHETERIZED Performed at [facility], [address] [place], [place] [address]    Special Requests   Final    Normal Performed at [facility], [address], [place], [place] [address]    Culture   Final    NO GROWTH Performed at [facility], [address] [place], [place] [address]    Report Status 11/01/2017 FINAL  Final  MRSA PCR Screening     Status: Abnormal   Collection Time: 10/31/17  9:30 PM  Result Value Ref Range Status   MRSA by PCR POSITIVE (A) NEGATIVE Final    Comment:        The GeneXpert MRSA Assay (FDA approved for NASAL specimens only), is one component of a comprehensive MRSA colonization surveillance program. It is not intended to diagnose MRSA infection nor to guide or monitor treatment for MRSA infections. RESULT CALLED TO, READ BACK BY  AND VERIFIED WITH: H.RICHARD RN AT [phone] ON 11/01/17 BY S.VANHOORNE Performed at [facility], [address] [place], [place] [address]       Radiology Studies: Ct Head Wo Contrast  Result Date: 10/31/2017 CLINICAL DATA:  Found down, head and neck pain. EXAM: CT HEAD WITHOUT CONTRAST CT CERVICAL SPINE WITHOUT CONTRAST TECHNIQUE: Multidetector CT imaging of the head and cervical  spine was performed following the standard protocol without intravenous contrast. Multiplanar CT image reconstructions of the cervical spine were also generated. COMPARISON:  None. FINDINGS: CT HEAD FINDINGS Brain: Study is significantly limited by patient motion artifact, despite multiple imaging attempts. Ventricles appear to be within normal limits in size and configuration. There is no obvious mass, hemorrhage, edema or other evidence of acute parenchymal abnormality. No extra-axial hemorrhage seen. Vascular: There are chronic calcified atherosclerotic changes of the large vessels at the skull base. No unexpected hyperdense vessel. Skull: No obvious fracture displacement. Sinuses/Orbits: No acute finding. Other: None. CT CERVICAL SPINE FINDINGS Cervical spine CT is also significantly limited by patient motion artifact. Alignment: No obvious evidence of acute vertebral body subluxation. Skull base and vertebrae: No obvious fracture line or displaced fracture fragment. Soft tissues and spinal canal: Unremarkable. Disc levels: No evidence of significant central canal stenosis at any level, although characterization at of the mid and lower levels is significantly limited by the motion artifact. Upper chest: Presumed large emphysematous bleb within the RIGHT lung apex, but difficult to characterize due to the extensive patient motion artifact. Other: None. IMPRESSION: 1. Head CT and cervical spine CT are both significantly limited by patient motion artifact (related to patient's Huntington's disease and associated  involuntary movements). 2. No obvious evidence of acute intracranial abnormality. No intracranial hemorrhage seen. No skull fracture seen. 3. No obvious fracture or dislocation within the cervical spine. 4. Presumed large emphysematous bleb within the RIGHT lung apex, but characterization again limited by the patient motion. Electronically Signed   By: Bary Richard M.D.   On: 10/31/2017 19:39   Ct Cervical Spine Wo Contrast  Result Date: 10/31/2017 CLINICAL DATA:  Found down, head and neck pain. EXAM: CT HEAD WITHOUT CONTRAST CT CERVICAL SPINE WITHOUT CONTRAST TECHNIQUE: Multidetector CT imaging of the head and cervical spine was performed following the standard protocol without intravenous contrast. Multiplanar CT image reconstructions of the cervical spine were also generated. COMPARISON:  None. FINDINGS: CT HEAD FINDINGS Brain: Study is significantly limited by patient motion artifact, despite multiple imaging attempts. Ventricles appear to be within normal limits in size and configuration. There is no obvious mass, hemorrhage, edema or other evidence of acute parenchymal abnormality. No extra-axial hemorrhage seen. Vascular: There are chronic calcified atherosclerotic changes of the large vessels at the skull base. No unexpected hyperdense vessel. Skull: No obvious fracture displacement. Sinuses/Orbits: No acute finding. Other: None. CT CERVICAL SPINE FINDINGS Cervical spine CT is also significantly limited by patient motion artifact. Alignment: No obvious evidence of acute vertebral body subluxation. Skull base and vertebrae: No obvious fracture line or displaced fracture fragment. Soft tissues and spinal canal: Unremarkable. Disc levels: No evidence of significant central canal stenosis at any level, although characterization at of the mid and lower levels is significantly limited by the motion artifact. Upper chest: Presumed large emphysematous bleb within the RIGHT lung apex, but difficult to  characterize due to the extensive patient motion artifact. Other: None. IMPRESSION: 1. Head CT and cervical spine CT are both significantly limited by patient motion artifact (related to patient's Huntington's disease and associated involuntary movements). 2. No obvious evidence of acute intracranial abnormality. No intracranial hemorrhage seen. No skull fracture seen. 3. No obvious fracture or dislocation within the cervical spine. 4. Presumed large emphysematous bleb within the RIGHT lung apex, but characterization again limited by the patient motion. Electronically Signed   By: Bary Richard M.D.   On: 10/31/2017 19:39   Ct Extrem Up  Entire Arm R W/cm  Result Date: 10/31/2017 CLINICAL DATA:  Right upper arm pain, swelling, and redness. History of Huntington's disease. EXAM: CT OF THE UPPER RIGHT EXTREMITY WITH CONTRAST TECHNIQUE: Multidetector CT imaging of the upper right extremity was performed according to the standard protocol following intravenous contrast administration. COMPARISON:  None. CONTRAST:  75mL OMNIPAQUE IOHEXOL 300 MG/ML  SOLN FINDINGS: Despite efforts by the technologist and patient, motion artifact is present on today's exam and could not be eliminated. This reduces exam sensitivity and specificity. Bones/Joint/Cartilage No acute fracture or dislocation. No cortical destruction or osteolysis. Mild degenerative changes of the glenohumeral joint. Osteopenia. Ligaments Suboptimally assessed by CT. Muscles and Tendons Ill-defined hypodensity within the triceps musculature. Small lipoma within the deltoid muscle. Remaining muscles are grossly unremarkable. The rotator cuff, biceps, triceps, common extensor, common flexor tendons are grossly intact. Soft tissues Moderate circumferential soft tissue swelling about the upper arm. No focal fluid collection. Prominent right axillary lymph nodes are likely reactive. Severe emphysematous change in the right upper lobe with large bullae. No acute  abnormality within the visualized abdomen and pelvis. 11 mm cystic skin lesion in the right lower back, likely a sebaceous cyst. IMPRESSION: 1. Moderate circumferential soft tissue swelling about the upper arm, nonspecific. Findings can be seen with cellulitis, venous insufficiency, or third spacing. No abscess. 2. Ill-defined hypodensity throughout the triceps musculature may reflect underlying myositis. 3. No CT evidence of osteomyelitis.  No acute osseous abnormality. 4.  Emphysema (ICD10-J43.9). Electronically Signed   By: Obie DredgeWilliam T Derry M.D.   On: 10/31/2017 14:46     Scheduled Meds: . citalopram  10 mg Oral Daily  . enoxaparin (LOVENOX) injection  40 mg Subcutaneous Q24H  . feeding supplement (ENSURE ENLIVE)  237 mL Oral TID BM  . multivitamin with minerals  1 tablet Oral Daily  . pantoprazole  40 mg Oral Daily  . tetrabenazine  25 mg Oral BID   Continuous Infusions: . cefTRIAXone (ROCEPHIN)  IV Stopped (11/02/17 1151)  . dextrose 5 % and 0.45% NaCl 75 mL/hr at 11/02/17 0901     LOS: 2 days   Time Spent in minutes   45 minutes  Solana Coggin D.O. on 11/02/2017 at 12:52 PM  Between 7am to 7pm - Pager - [phone]  After 7pm go to [email or web address] - password [email or web address]  And look for the night coverage person covering for me after hours  [facility] Office  [phone]

## 2017-11-03 LAB — BASIC METABOLIC PANEL
Anion gap: 7 (ref 5–15)
BUN: 16 mg/dL (ref 6–20)
CALCIUM: 8.3 mg/dL — AB (ref 8.9–10.3)
CO2: 27 mmol/L (ref 22–32)
Chloride: 106 mmol/L (ref 98–111)
Creatinine, Ser: 0.61 mg/dL (ref 0.61–1.24)
GFR calc Af Amer: >60 mL/min (ref >60)
GLUCOSE: 161 mg/dL — AB (ref 70–99)
Potassium: 3.6 mmol/L (ref 3.5–5.1)
Sodium: 140 mmol/L (ref 135–145)

## 2017-11-03 LAB — CBC
HCT: 31.9 % — ABNORMAL LOW (ref 39.0–52.0)
Hemoglobin: 10.4 g/dL — ABNORMAL LOW (ref 13.0–17.0)
MCH: 30.1 pg (ref 26.0–34.0)
MCHC: 32.6 g/dL (ref 30.0–36.0)
MCV: 92.5 fL (ref 78.0–100.0)
PLATELETS: 172 10*3/uL (ref 150–400)
RBC: 3.45 MIL/uL — ABNORMAL LOW (ref 4.22–5.81)
RDW: 14.4 % (ref 11.5–15.5)
WBC: 9.1 10*3/uL (ref 4.0–10.5)

## 2017-11-03 MED ORDER — MUPIROCIN 2 % EX OINT
1.0000 "application " | TOPICAL_OINTMENT | Freq: Two times a day (BID) | CUTANEOUS | Status: AC
  Administered 2017-11-03 – 2017-11-07 (×10): 1 via NASAL
  Filled 2017-11-03 (×3): qty 22

## 2017-11-03 MED ORDER — CHLORHEXIDINE GLUCONATE CLOTH 2 % EX PADS
6.0000 | MEDICATED_PAD | Freq: Every day | CUTANEOUS | Status: AC
  Administered 2017-11-04 – 2017-11-08 (×5): 6 via TOPICAL

## 2017-11-03 NOTE — Care Management Important Message (Signed)
Important Message  Patient Details  Name: Samuel Lambert Cowger MRN: [id number] Date of Birth: 07/08/1955   Medicare Important Message Given:  Yes    Caren MacadamFuller, Taira Knabe 11/03/2017, 11:08 AMImportant Message  Patient Details  Name: Samuel Lambert Halk MRN: [id number] Date of Birth: 04/21/1956   Medicare Important Message Given:  Yes    Caren MacadamFuller, Ramiah Helfrich 11/03/2017, 11:07 AM

## 2017-11-03 NOTE — Progress Notes (Signed)
Shift event: Notified by RN pt has only voided 100 cc in condom cath bag all shift, bladder scan right now shows approx 500 cc. Attempts to place foley and Coude cath have been unsuccessful d/t significant resistance. Pt unable to communicate need or urge to void.  Assessment/Plan: 1. Urinary retention: Spoke w/ Dr Berneice HeinrichManny w/ urology service who stated that because of his neurological disorder his capacity and voiding threshold may be much greater. Recommends not to do anything unless he goes several more hours. RN informed of plan. Will inform rounding MD of this information. Will continue to monitor closely on Med-surg.   Leanne ChangKatherine P. Aritza Brunet, NP-C [facility] Pager [phone]

## 2017-11-03 NOTE — Progress Notes (Signed)
PROGRESS NOTE    Samuel Lambert  ZOX:[id number] DOB: 10-19-1955 DOA: 10/31/2017 PCP: [name], PA   Brief Narrative:  HPI on 11/01/2017 by Dr. Delrae Sawyers Samuel Lambert is a 62 y.o. male with medical history significant of Huntington's disease, who follows up with neurology clinic.  His movement disorder has been severe affecting his quality of life and overall health.  He is a resident from the nursing home.  Patient was brought to the hospital due to decreased p.o. intake, in the emergency department he was noted to have a right upper extremity rash.    On direct questioning patient reports severe pain of the right upper extremity, associated with pruritus, no improving or worsening factors, denies any other associated symptoms, symptoms present for last few days.  Unable to get a clear history due to patient's current cognitive dysfunction.   Interim history  Admitted for sepsis secondary to right upper extremity cellulitis and myositis, placed on IV vancomycin and Zosyn.  Assessment & Plan   Sepsis secondary to right upper extreme the cellulitis/myositis -Patient presented with tachycardia, tachypnea, elevated lactic acid -CT right upper extremity: Moderate circumferential soft tissue swelling about the upper arm, can be seen with cellulitis, venous insufficiency or third spacing.  No abscess.  Ill-defined hypodensity throughout the triceps musculature, may reflect underlying myositis.  No osseous abnormality. -Blood cultures show no growth to date -Placed on IV Zosyn and vancomycin -Orthopedic surgery consulted and appreciated-commended continuing IV antibiotics for now, no surgery. -[name] negative for DVT -elevated RUE -slow to improve  Acute kidney injury complicated by hypernatremia and hyperchloremia -Resolved -Suspect secondary to sepsis and dehydration, prerenal causes -Creatinine 1.42 on admission, currently 0.61 -Placed on IV fluids -Continue to monitor  BMP  Huntington's chorea -Patient does have low functional capacity and is an overall poor health -Continue supportive medical care -Continue citalopram and tetrabenazine  Dyspepsia -Continue famotidine  Normocytic anemia -hemoglobin 10.4, drop from 14 (suspect delusional component as patient is receiving IV fluids) -No previous hemoglobin for comparison -continue to monitor CBC  Protein calorie malnutrition -Nutrition consulted, continue supplements  DVT Prophylaxis  Lovenox  Code Status: Full  Family Communication: None at bedside  Disposition Plan: Admitted. Pending improvement  Consultants Orthopedic surgery  Procedures  None  Antibiotics   Anti-infectives (From admission, onward)   Start     Dose/Rate Route Frequency Ordered Stop   11/02/17 1200  cefTRIAXone (ROCEPHIN) 1 g in sodium chloride 0.9 % 100 mL IVPB     1 g 200 mL/hr over 30 Minutes Intravenous Every 24 hours 11/02/17 0911     11/01/17 2200  vancomycin (VANCOCIN) 1,250 mg in sodium chloride 0.9 % 250 mL IVPB  Status:  Discontinued     1,250 mg 166.7 mL/hr over 90 Minutes Intravenous Every 48 hours 10/31/17 2142 11/02/17 0911   11/01/17 0400  piperacillin-tazobactam (ZOSYN) IVPB 3.375 g  Status:  Discontinued     3.375 g 12.5 mL/hr over 240 Minutes Intravenous Every 8 hours 10/31/17 2142 11/02/17 0911   10/31/17 2130  piperacillin-tazobactam (ZOSYN) IVPB 3.375 g  Status:  Discontinued     3.375 g 100 mL/hr over 30 Minutes Intravenous  Once 10/31/17 2115 10/31/17 2115   10/31/17 2130  vancomycin (VANCOCIN) IVPB 1000 mg/200 mL premix  Status:  Discontinued     1,000 mg 200 mL/hr over 60 Minutes Intravenous  Once 10/31/17 2115 10/31/17 2115   10/31/17 1815  piperacillin-tazobactam (ZOSYN) IVPB 3.375 g     3.375 g  100 mL/hr over 30 Minutes Intravenous NOW 10/31/17 1812 10/31/17 2350   10/31/17 1100  piperacillin-tazobactam (ZOSYN) IVPB 3.375 g     3.375 g 100 mL/hr over 30 Minutes Intravenous  Once  10/31/17 1049 10/31/17 1152   10/31/17 1100  vancomycin (VANCOCIN) IVPB 1000 mg/200 mL premix     1,000 mg 200 mL/hr over 60 Minutes Intravenous  Once 10/31/17 1049 10/31/17 1220      Subjective:   Samuel Lambert seen and examined today.  Continues to complain of right arm pain. Denies current chest pain, shortness of breath, abdominal pain.   Objective:   Vitals:   11/02/17 0527 11/02/17 1349 11/02/17 2216 11/03/17 0431  BP: 109/69 117/68 112/72 108/67  Pulse: 84 84 81 72  Resp: 20 15 20 20   Temp: 98.9 F (37.2 C) 98.2 F (36.8 C) 99.1 F (37.3 C) 98.5 F (36.9 C)  TempSrc: Oral Oral Oral Oral  SpO2: 97% 98% 94% 97%  Weight:      Height:        Intake/Output Summary (Last 24 hours) at 11/03/2017 1059 Last data filed at 11/03/2017 0748 Gross per 24 hour  Intake 840 ml  Output 1250 ml  Net -410 ml   Filed Weights   11/01/17 1425  Weight: 65.8 kg (145 lb)   Exam  General: Well developed, chronically ill appearing, NAD  HEENT: NCAT, mucous membranes moist.   Neck: Supple  Cardiovascular: S1 S2 auscultated, no murmurs, RRR  Respiratory: Diminished breath sounds, no wheezing   Abdomen: Soft, nontender, nondistended, + bowel sounds  Extremities: warm dry without cyanosis clubbing or edema  Neuro: Awake and alert, hard of hearing, moves all extremities   Psych: Normal affect and demeanor  Data Reviewed: I have personally reviewed following labs and imaging studies  CBC: Recent Labs  Lab 10/31/17 1048 11/01/17 0417 11/02/17 0358 11/03/17 0355  WBC 10.6* 6.2 7.2 9.1  NEUTROABS 8.3  --  6.1  --   HGB 14.6 14.0 10.7* 10.4*  HCT 43.5 44.1 33.1* 31.9*  MCV 94.0 91.9 91.9 92.5  PLT 417* 225 219 172   Basic Metabolic Panel: Recent Labs  Lab 10/31/17 1048 11/01/17 0417 11/02/17 0358 11/03/17 0355  NA 149* 148* 145 140  K 3.7 3.7 3.4* 3.6  CL 112* 113* 108 106  CO2 22 27 28 27   GLUCOSE 144* 218* 201* 161*  BUN 41* 40* 28* 16  CREATININE 1.42* 1.09  0.84 0.61  CALCIUM 8.5* 8.6* 8.4* 8.3*   GFR: Estimated Creatinine Clearance: 91.4 mL/min (by C-G formula based on SCr of 0.61 mg/dL). Liver Function Tests: Recent Labs  Lab 10/31/17 1048  AST 34  ALT 23  ALKPHOS 51  BILITOT 0.7  PROT 5.4*  ALBUMIN 2.4*   No results for input(s): LIPASE, AMYLASE in the last 168 hours. No results for input(s): AMMONIA in the last 168 hours. Coagulation Profile: No results for input(s): INR, PROTIME in the last 168 hours. Cardiac Enzymes: No results for input(s): CKTOTAL, CKMB, CKMBINDEX, TROPONINI in the last 168 hours. BNP (last 3 results) No results for input(s): PROBNP in the last 8760 hours. HbA1C: No results for input(s): HGBA1C in the last 72 hours. CBG: No results for input(s): GLUCAP in the last 168 hours. Lipid Profile: No results for input(s): CHOL, HDL, LDLCALC, TRIG, CHOLHDL, LDLDIRECT in the last 72 hours. Thyroid Function Tests: No results for input(s): TSH, T4TOTAL, FREET4, T3FREE, THYROIDAB in the last 72 hours. Anemia Panel: No results for input(s): VITAMINB12,  FOLATE, FERRITIN, TIBC, IRON, RETICCTPCT in the last 72 hours. Urine analysis:    Component Value Date/Time   COLORURINE AMBER (A) 10/31/2017 1529   APPEARANCEUR CLOUDY (A) 10/31/2017 1529   LABSPEC 1.027 10/31/2017 1529   PHURINE 5.0 10/31/2017 1529   GLUCOSEU 50 (A) 10/31/2017 1529   HGBUR LARGE (A) 10/31/2017 1529   BILIRUBINUR NEGATIVE 10/31/2017 1529   KETONESUR NEGATIVE 10/31/2017 1529   PROTEINUR 100 (A) 10/31/2017 1529   NITRITE NEGATIVE 10/31/2017 1529   LEUKOCYTESUR NEGATIVE 10/31/2017 1529   Sepsis Labs: @LABRCNTIP (procalcitonin:4,lacticidven:4)  ) Recent Results (from the past 240 hour(s))  Blood Culture (routine x 2)     Status: None (Preliminary result)   Collection Time: 10/31/17 10:48 AM  Result Value Ref Range Status   Specimen Description   Final    BLOOD RIGHT ANTECUBITAL Performed at [facility], [address] [place], [place] [address]    Special Requests   Final    BOTTLES DRAWN AEROBIC AND ANAEROBIC Blood Culture adequate volume Performed at [facility], [address] [place], [place] [address]    Culture  Setup Time PENDING  Incomplete   Culture   Final    NO GROWTH 3 DAYS Performed at [facility], [address] [place], [place] [address]    Report Status PENDING  Incomplete  Blood Culture (routine x 2)     Status: None (Preliminary result)   Collection Time: 10/31/17 10:53 AM  Result Value Ref Range Status   Specimen Description   Final    BLOOD LEFT FOREARM Performed at [facility], [address] [place], [place] [address]    Special Requests   Final    BOTTLES DRAWN AEROBIC AND ANAEROBIC Blood Culture adequate volume Performed at [facility], [address] [place], [place] [address]    Culture   Final    NO GROWTH 3 DAYS Performed at [facility], [address] [place], [place] [address]    Report Status PENDING  Incomplete  Urine culture     Status: None   Collection Time: 10/31/17  3:29 PM  Result Value Ref Range Status   Specimen Description   Final    URINE, CATHETERIZED Performed at [facility], [address] [place], [place] [address]    Special Requests   Final    Normal Performed at [facility], [address] [place], [place] [address]    Culture   Final    NO GROWTH Performed at [facility], [address] [place], [place] [address]    Report Status 11/01/2017 FINAL  Final  MRSA PCR Screening     Status: Abnormal   Collection Time: 10/31/17  9:30 PM  Result Value Ref Range Status   MRSA by PCR POSITIVE (A) NEGATIVE Final    Comment:        The GeneXpert MRSA Assay (FDA approved for NASAL specimens only), is one component of a comprehensive MRSA colonization surveillance program. It is not intended to diagnose  MRSA infection nor to guide or monitor treatment for MRSA infections. RESULT CALLED TO, READ BACK BY AND VERIFIED WITH: H.RICHARD RN AT [phone] ON 11/01/17 BY S.VANHOORNE Performed at [facility], [address] [place], [place] [address]       Radiology Studies: No results found.   Scheduled Meds: . citalopram  10 mg Oral Daily  . enoxaparin (LOVENOX)  injection  40 mg Subcutaneous Q24H  . feeding supplement (ENSURE ENLIVE)  237 mL Oral TID BM  . multivitamin with minerals  1 tablet Oral Daily  . pantoprazole  40 mg Oral Daily  . tetrabenazine  25 mg Oral BID   Continuous Infusions: . cefTRIAXone (ROCEPHIN)  IV Stopped (11/02/17 1151)  . dextrose 5 % and 0.45% NaCl 75 mL/hr at 11/03/17 1021     LOS: 3 days   Time Spent in minutes   30 minutes  Wren Pryce D.O. on 11/03/2017 at 10:59 AM  Between 7am to 7pm - Pager - [phone]  After 7pm go to [email or web address] - password [email or web address]  And look for the night coverage person covering for me after hours  [facility] Office  [phone]

## 2017-11-04 NOTE — Progress Notes (Signed)
PROGRESS NOTE    Samuel Lambert  WUX:[id number] DOB: 09/07/1955 DOA: 10/31/2017 PCP: [name], PA   Brief Narrative:  HPI on 11/01/2017 by Dr. Delrae Sawyers Samuel Lambert is a 62 y.o. male with medical history significant of Huntington's disease, who follows up with neurology clinic.  His movement disorder has been severe affecting his quality of life and overall health.  He is a resident from the nursing home.  Patient was brought to the hospital due to decreased p.o. intake, in the emergency department he was noted to have a right upper extremity rash.    On direct questioning patient reports severe pain of the right upper extremity, associated with pruritus, no improving or worsening factors, denies any other associated symptoms, symptoms present for last few days.  Unable to get a clear history due to patient's current cognitive dysfunction.   Interim history  Admitted for sepsis secondary to right upper extremity cellulitis and myositis, placed on IV vancomycin and Zosyn.  Assessment & Plan   Sepsis secondary to right upper extreme the cellulitis/myositis -Patient presented with tachycardia, tachypnea, elevated lactic acid -CT right upper extremity: Moderate circumferential soft tissue swelling about the upper arm, can be seen with cellulitis, venous insufficiency or third spacing.  No abscess.  Ill-defined hypodensity throughout the triceps musculature, may reflect underlying myositis.  No osseous abnormality. -Blood cultures show no growth to date -Placed on IV Zosyn and vancomycin -Orthopedic surgery consulted and appreciated-recommended continuing IV antibiotics for now, no surgery. Reassessed today 6/28 by [name], continue IV antibiotics and transition to PO after the weekend -[name] negative for DVT -elevated RUE -slow to improve  Acute kidney injury complicated by hypernatremia and hyperchloremia -Resolved -Suspect secondary to sepsis and dehydration, prerenal  causes -Creatinine 1.42 on admission, currently 0.61 -Placed on IV fluids -Continue to monitor BMP  Huntington's chorea -Patient does have low functional capacity and is an overall poor health -Continue supportive medical care -Continue citalopram and tetrabenazine  Dyspepsia -Continue famotidine  Normocytic anemia -hemoglobin 10.4, drop from 14 (suspect delusional component as patient is receiving IV fluids) -No previous hemoglobin for comparison -continue to monitor CBC  Protein calorie malnutrition -Nutrition consulted, continue supplements  DVT Prophylaxis  Lovenox  Code Status: Full  Family Communication: None at bedside  Disposition Plan: Admitted. Pending improvement. Suspect discharge to ALF within the next 3-4 days as patient's infection has been slow to improve.   Consultants Orthopedic surgery  Procedures  Right Upper extremity doppler  Antibiotics   Anti-infectives (From admission, onward)   Start     Dose/Rate Route Frequency Ordered Stop   11/02/17 1200  cefTRIAXone (ROCEPHIN) 1 g in sodium chloride 0.9 % 100 mL IVPB     1 g 200 mL/hr over 30 Minutes Intravenous Every 24 hours 11/02/17 0911     11/01/17 2200  vancomycin (VANCOCIN) 1,250 mg in sodium chloride 0.9 % 250 mL IVPB  Status:  Discontinued     1,250 mg 166.7 mL/hr over 90 Minutes Intravenous Every 48 hours 10/31/17 2142 11/02/17 0911   11/01/17 0400  piperacillin-tazobactam (ZOSYN) IVPB 3.375 g  Status:  Discontinued     3.375 g 12.5 mL/hr over 240 Minutes Intravenous Every 8 hours 10/31/17 2142 11/02/17 0911   10/31/17 2130  piperacillin-tazobactam (ZOSYN) IVPB 3.375 g  Status:  Discontinued     3.375 g 100 mL/hr over 30 Minutes Intravenous  Once 10/31/17 2115 10/31/17 2115   10/31/17 2130  vancomycin (VANCOCIN) IVPB 1000 mg/200 mL premix  Status:  Discontinued     1,000 mg 200 mL/hr over 60 Minutes Intravenous  Once 10/31/17 2115 10/31/17 2115   10/31/17 1815  piperacillin-tazobactam  (ZOSYN) IVPB 3.375 g     3.375 g 100 mL/hr over 30 Minutes Intravenous NOW 10/31/17 1812 10/31/17 2350   10/31/17 1100  piperacillin-tazobactam (ZOSYN) IVPB 3.375 g     3.375 g 100 mL/hr over 30 Minutes Intravenous  Once 10/31/17 1049 10/31/17 1152   10/31/17 1100  vancomycin (VANCOCIN) IVPB 1000 mg/200 mL premix     1,000 mg 200 mL/hr over 60 Minutes Intravenous  Once 10/31/17 1049 10/31/17 1220      Subjective:   Samuel Lambert seen and examined today.  Continues to complain of right arm pain and swelling.  Does not want surgery.  Denies current chest pain or shortness of breath.  Objective:   Vitals:   11/03/17 0431 11/03/17 1500 11/03/17 2226 11/04/17 0615  BP: 108/67 109/68 129/80 122/75  Pulse: 72 65 85 74  Resp: 20 18 20 16   Temp: 98.5 F (36.9 C) 98.1 F (36.7 C) 97.8 F (36.6 C) 98 F (36.7 C)  TempSrc: Oral  Axillary Axillary  SpO2: 97% 99% 95% 100%  Weight:      Height:        Intake/Output Summary (Last 24 hours) at 11/04/2017 1052 Last data filed at 11/04/2017 0900 Gross per 24 hour  Intake 4640 ml  Output 1250 ml  Net 3390 ml   Filed Weights   11/01/17 1425  Weight: 65.8 kg (145 lb)   Exam  General: Well developed, chronically ill-appearing, no apparent distress  HEENT: NCAT, mucous membranes moist.   Neck: Supple  Cardiovascular: S1 S2 auscultated, no rubs, murmurs or gallops. Regular rate and rhythm.  Respiratory: Minutes breath sounds however clear, no wheezing  Abdomen: Soft, nontender, nondistended, + bowel sounds  Extremities: warm dry without cyanosis clubbing or edema  Neuro: Awake and alert, hard of hearing, moves all extremities and answers simple questions  Psych: appropriate mood and affect, pleasant  Data Reviewed: I have personally reviewed following labs and imaging studies  CBC: Recent Labs  Lab 10/31/17 1048 11/01/17 0417 11/02/17 0358 11/03/17 0355  WBC 10.6* 6.2 7.2 9.1  NEUTROABS 8.3  --  6.1  --   HGB 14.6  14.0 10.7* 10.4*  HCT 43.5 44.1 33.1* 31.9*  MCV 94.0 91.9 91.9 92.5  PLT 417* 225 219 172   Basic Metabolic Panel: Recent Labs  Lab 10/31/17 1048 11/01/17 0417 11/02/17 0358 11/03/17 0355  NA 149* 148* 145 140  K 3.7 3.7 3.4* 3.6  CL 112* 113* 108 106  CO2 22 27 28 27   GLUCOSE 144* 218* 201* 161*  BUN 41* 40* 28* 16  CREATININE 1.42* 1.09 0.84 0.61  CALCIUM 8.5* 8.6* 8.4* 8.3*   GFR: Estimated Creatinine Clearance: 91.4 mL/min (by C-G formula based on SCr of 0.61 mg/dL). Liver Function Tests: Recent Labs  Lab 10/31/17 1048  AST 34  ALT 23  ALKPHOS 51  BILITOT 0.7  PROT 5.4*  ALBUMIN 2.4*   No results for input(s): LIPASE, AMYLASE in the last 168 hours. No results for input(s): AMMONIA in the last 168 hours. Coagulation Profile: No results for input(s): INR, PROTIME in the last 168 hours. Cardiac Enzymes: No results for input(s): CKTOTAL, CKMB, CKMBINDEX, TROPONINI in the last 168 hours. BNP (last 3 results) No results for input(s): PROBNP in the last 8760 hours. HbA1C: No results for input(s): HGBA1C in the  last 72 hours. CBG: No results for input(s): GLUCAP in the last 168 hours. Lipid Profile: No results for input(s): CHOL, HDL, LDLCALC, TRIG, CHOLHDL, LDLDIRECT in the last 72 hours. Thyroid Function Tests: No results for input(s): TSH, T4TOTAL, FREET4, T3FREE, THYROIDAB in the last 72 hours. Anemia Panel: No results for input(s): VITAMINB12, FOLATE, FERRITIN, TIBC, IRON, RETICCTPCT in the last 72 hours. Urine analysis:    Component Value Date/Time   COLORURINE AMBER (A) 10/31/2017 1529   APPEARANCEUR CLOUDY (A) 10/31/2017 1529   LABSPEC 1.027 10/31/2017 1529   PHURINE 5.0 10/31/2017 1529   GLUCOSEU 50 (A) 10/31/2017 1529   HGBUR LARGE (A) 10/31/2017 1529   BILIRUBINUR NEGATIVE 10/31/2017 1529   KETONESUR NEGATIVE 10/31/2017 1529   PROTEINUR 100 (A) 10/31/2017 1529   NITRITE NEGATIVE 10/31/2017 1529   LEUKOCYTESUR NEGATIVE 10/31/2017 1529   Sepsis  Labs: @LABRCNTIP (procalcitonin:4,lacticidven:4)  ) Recent Results (from the past 240 hour(s))  Blood Culture (routine x 2)     Status: None (Preliminary result)   Collection Time: 10/31/17 10:48 AM  Result Value Ref Range Status   Specimen Description   Final    BLOOD RIGHT ANTECUBITAL Performed at [facility], [address] [place], [place] [address]    Special Requests   Final    BOTTLES DRAWN AEROBIC AND ANAEROBIC Blood Culture adequate volume Performed at [facility], [address] [place], [place] [address]    Culture   Final    NO GROWTH 4 DAYS Performed at [facility], [address] [place], [place] [address]    Report Status PENDING  Incomplete  Blood Culture (routine x 2)     Status: None (Preliminary result)   Collection Time: 10/31/17 10:53 AM  Result Value Ref Range Status   Specimen Description   Final    BLOOD LEFT FOREARM Performed at [facility], [address], [place], [place] [address]    Special Requests   Final    BOTTLES DRAWN AEROBIC AND ANAEROBIC Blood Culture adequate volume Performed at [facility], [address] [place], [place] [address]    Culture   Final    NO GROWTH 4 DAYS Performed at [facility], [address], [place], [place] [address]    Report Status PENDING  Incomplete  Urine culture     Status: None   Collection Time: 10/31/17  3:29 PM  Result Value Ref Range Status   Specimen Description   Final    URINE, CATHETERIZED Performed at [facility], [address], [place], [place] [address]    Special Requests   Final    Normal Performed at [facility], [address], [place], [place] [address]    Culture   Final    NO GROWTH Performed at [facility], [address], [place], [place] [address]    Report Status 11/01/2017 FINAL  Final  MRSA PCR Screening     Status: Abnormal    Collection Time: 10/31/17  9:30 PM  Result Value Ref Range Status   MRSA by PCR POSITIVE (A) NEGATIVE Final    Comment:        The GeneXpert MRSA Assay (FDA approved for NASAL specimens only), is one component of a comprehensive MRSA colonization surveillance program. It is not intended to diagnose MRSA infection nor to guide or monitor treatment for MRSA infections. RESULT CALLED TO, READ BACK BY AND VERIFIED WITH: H.RICHARD  RN AT [phone] ON 11/01/17 BY S.VANHOORNE Performed at [facility], [address] [place], [place] [address]       Radiology Studies: No results found.   Scheduled Meds: . Chlorhexidine Gluconate Cloth  6 each Topical Q0600  . citalopram  10 mg Oral Daily  . enoxaparin (LOVENOX) injection  40 mg Subcutaneous Q24H  . feeding supplement (ENSURE ENLIVE)  237 mL Oral TID BM  . multivitamin with minerals  1 tablet Oral Daily  . mupirocin ointment  1 application Nasal BID  . pantoprazole  40 mg Oral Daily  . tetrabenazine  25 mg Oral BID   Continuous Infusions: . cefTRIAXone (ROCEPHIN)  IV Stopped (11/03/17 1415)  . dextrose 5 % and 0.45% NaCl 75 mL/hr at 11/03/17 2121     LOS: 4 days   Time Spent in minutes   30 minutes  Ashvik Grundman D.O. on 11/04/2017 at 10:52 AM  Between 7am to 7pm - Pager - [phone]  After 7pm go to [email or web address] - password [email or web address]  And look for the night coverage person covering for me after hours  [facility] Office  [phone]

## 2017-11-04 NOTE — Progress Notes (Signed)
Spoke with Pt's ALF [facility] to provide update on care.  ALF reports pt would not be able to readmit if on a weekend due to no access to their pharmacy.  Will need updated FL2 at DC. Will continue communicating with ALF re: pt's DC plan.  Ilean SkillMeghan Zackaria Burkey, MSW, LCSW Clinical Social Work 11/04/2017 [phone]

## 2017-11-04 NOTE — Progress Notes (Signed)
Patient ID: Samuel BeneRoger Lambert, male   DOB: 02/03/1956, 62 y.o.   MRN: [id number]  No change with regards to RUE pain complaints   AF VSS  RUE swelling and erythema appear to be improving No fluctuance  A:  RUE cellulitis and underlying reactive myositis  P: Continue IV antibiotics with a plan to transition to PO antibiotics maybe after weekend I will be able to recheck perhaps Monday if still here

## 2017-11-05 LAB — CULTURE, BLOOD (ROUTINE X 2)
CULTURE: NO GROWTH
Special Requests: ADEQUATE

## 2017-11-05 LAB — BASIC METABOLIC PANEL
Anion gap: 7 (ref 5–15)
BUN: 12 mg/dL (ref 6–20)
CO2: 27 mmol/L (ref 22–32)
Calcium: 7.9 mg/dL — ABNORMAL LOW (ref 8.9–10.3)
Chloride: 106 mmol/L (ref 98–111)
Creatinine, Ser: 0.51 mg/dL — ABNORMAL LOW (ref 0.61–1.24)
Glucose, Bld: 131 mg/dL — ABNORMAL HIGH (ref 70–99)
POTASSIUM: 4.1 mmol/L (ref 3.5–5.1)
SODIUM: 140 mmol/L (ref 135–145)

## 2017-11-05 MED ORDER — METRONIDAZOLE IN NACL 5-0.79 MG/ML-% IV SOLN
500.0000 mg | Freq: Three times a day (TID) | INTRAVENOUS | Status: DC
Start: 2017-11-05 — End: 2017-11-10
  Administered 2017-11-05 – 2017-11-10 (×15): 500 mg via INTRAVENOUS
  Filled 2017-11-05 (×16): qty 100

## 2017-11-05 NOTE — Progress Notes (Signed)
PHARMACY - PHYSICIAN COMMUNICATION CRITICAL VALUE ALERT - BLOOD CULTURE IDENTIFICATION (BCID)  Samuel Lambert is an 62 y.o. male who [facility] to [facility] 10/[facility]/2017 with a chief complaint of reduced PO intake an RUE erythema  Assessment:  6/24 BCx with GNR in anaerobic bottle only of one set (no BCID)  Name of physician (or Provider) Contacted: Mikhail  Current antibiotics: ceftriaxone  Changes to prescribed antibiotics recommended:  Recommend add metronidazole   No results found for this or any previous visit.  Samuel Lambert, PharmD, BCPS.   Pager: [phone] 11/05/2017 10:32 AM

## 2017-11-05 NOTE — Progress Notes (Signed)
PROGRESS NOTE    Samuel Lambert  ZOX:[id number] DOB: Dec 21, 1955 DOA: 10/31/2017 PCP: [name], PA   Brief Narrative:  HPI on 11/01/2017 by Dr. Delrae Sawyers Samuel Lambert is a 62 y.o. male with medical history significant of Huntington's disease, who follows up with neurology clinic.  His movement disorder has been severe affecting his quality of life and overall health.  He is a resident from the nursing home.  Patient was brought to the hospital due to decreased p.o. intake, in the emergency department he was noted to have a right upper extremity rash.    On direct questioning patient reports severe pain of the right upper extremity, associated with pruritus, no improving or worsening factors, denies any other associated symptoms, symptoms present for last few days.  Unable to get a clear history due to patient's current cognitive dysfunction.   Interim history  Admitted for sepsis secondary to right upper extremity cellulitis and myositis, placed on IV vancomycin and Zosyn.  Assessment & Plan   Sepsis secondary to right upper extreme the cellulitis/myositis -Patient presented with tachycardia, tachypnea, elevated lactic acid -CT right upper extremity: Moderate circumferential soft tissue swelling about the upper arm, can be seen with cellulitis, venous insufficiency or third spacing.  No abscess.  Ill-defined hypodensity throughout the triceps musculature, may reflect underlying myositis.  No osseous abnormality. -Blood cultures 1/2 from 10/31/2017 +GNR -Placed on IV Zosyn and vancomycin and transitioned to ceftriaxone -Orthopedic surgery consulted and appreciated-recommended continuing IV antibiotics for now, no surgery. Reassessed today 6/28 by [name], continue IV antibiotics and transition to PO after the weekend -[name] negative for DVT -elevated RUE -slow to improve -Will add on flagyl for GNR -repeat blood cultures  Acute kidney injury complicated by hypernatremia and  hyperchloremia -Resolved -Suspect secondary to sepsis and dehydration, prerenal causes -Creatinine 1.42 on admission, currently 0.51 -Placed on IV fluids -Continue to monitor BMP  Huntington's chorea -Patient does have low functional capacity and is an overall poor health -Continue supportive medical care -Continue citalopram and tetrabenazine  Dyspepsia -Continue famotidine  Normocytic anemia -hemoglobin 10.4, drop from 14 (suspect delusional component as patient is receiving IV fluids) -No previous hemoglobin for comparison -continue to monitor CBC  Protein calorie malnutrition -Nutrition consulted, continue supplements  DVT Prophylaxis  Lovenox  Code Status: Full  Family Communication: None at bedside  Disposition Plan: Admitted. Pending improvement. Suspect discharge to ALF within the next 3-4 days as patient's infection has been slow to improve.   Consultants Orthopedic surgery  Procedures  Right Upper extremity doppler  Antibiotics   Anti-infectives (From admission, onward)   Start     Dose/Rate Route Frequency Ordered Stop   11/05/17 1200  metroNIDAZOLE (FLAGYL) IVPB 500 mg     500 mg 100 mL/hr over 60 Minutes Intravenous Every 8 hours 11/05/17 1041     11/02/17 1200  cefTRIAXone (ROCEPHIN) 1 g in sodium chloride 0.9 % 100 mL IVPB     1 g 200 mL/hr over 30 Minutes Intravenous Every 24 hours 11/02/17 0911     11/01/17 2200  vancomycin (VANCOCIN) 1,250 mg in sodium chloride 0.9 % 250 mL IVPB  Status:  Discontinued     1,250 mg 166.7 mL/hr over 90 Minutes Intravenous Every 48 hours 10/31/17 2142 11/02/17 0911   11/01/17 0400  piperacillin-tazobactam (ZOSYN) IVPB 3.375 g  Status:  Discontinued     3.375 g 12.5 mL/hr over 240 Minutes Intravenous Every 8 hours 10/31/17 2142 11/02/17 0911   10/31/17 2130  piperacillin-tazobactam (ZOSYN) IVPB 3.375 g  Status:  Discontinued     3.375 g 100 mL/hr over 30 Minutes Intravenous  Once 10/31/17 2115 10/31/17 2115    10/31/17 2130  vancomycin (VANCOCIN) IVPB 1000 mg/200 mL premix  Status:  Discontinued     1,000 mg 200 mL/hr over 60 Minutes Intravenous  Once 10/31/17 2115 10/31/17 2115   10/31/17 1815  piperacillin-tazobactam (ZOSYN) IVPB 3.375 g     3.375 g 100 mL/hr over 30 Minutes Intravenous NOW 10/31/17 1812 10/31/17 2350   10/31/17 1100  piperacillin-tazobactam (ZOSYN) IVPB 3.375 g     3.375 g 100 mL/hr over 30 Minutes Intravenous  Once 10/31/17 1049 10/31/17 1152   10/31/17 1100  vancomycin (VANCOCIN) IVPB 1000 mg/200 mL premix     1,000 mg 200 mL/hr over 60 Minutes Intravenous  Once 10/31/17 1049 10/31/17 1220      Subjective:   [name] seen and examined today.  Continues to complain of right arm pain. Has no other complaints today.  Objective:   Vitals:   11/04/17 0615 11/04/17 1432 11/04/17 2028 11/05/17 0500  BP: 122/75 102/74 138/81 120/68  Pulse: 74 66 75 70  Resp: 16 16 14 12   Temp: 98 F (36.7 C) 98.3 F (36.8 C) 97.9 F (36.6 C) 98 F (36.7 C)  TempSrc: Axillary Axillary Axillary Axillary  SpO2: 100% 100% 100% 100%  Weight:      Height:        Intake/Output Summary (Last 24 hours) at 11/05/2017 1047 Last data filed at 11/05/2017 0500 Gross per 24 hour  Intake 2475 ml  Output 1420 ml  Net 1055 ml   Filed Weights   11/01/17 1425  Weight: 65.8 kg (145 lb)   Exam  General: Well developed, chronically ill appearing, NAD  HEENT: NCAT, mucous membranes moist.   Neck: Supple, no JVD, no masses  Cardiovascular: S1 S2 auscultated, no rubs, murmurs or gallops. Regular rate and rhythm.  Respiratory: Clear to auscultation bilaterally with equal chest rise  Abdomen: Soft, nontender, nondistended, + bowel sounds  Extremities: warm dry without cyanosis clubbing or edema of LE. +RUE edema with erythema  Neuro: Awake and alert, hard of hearing. Moves all extremities. Answers simple questions.  Skin: RUE erythema   Psych: pleasant, appropriate   Data  Reviewed: I have personally reviewed following labs and imaging studies  CBC: Recent Labs  Lab 10/31/17 1048 11/01/17 0417 11/02/17 0358 11/03/17 0355  WBC 10.6* 6.2 7.2 9.1  NEUTROABS 8.3  --  6.1  --   HGB 14.6 14.0 10.7* 10.4*  HCT 43.5 44.1 33.1* 31.9*  MCV 94.0 91.9 91.9 92.5  PLT 417* 225 219 172   Basic Metabolic Panel: Recent Labs  Lab 10/31/17 1048 11/01/17 0417 11/02/17 0358 11/03/17 0355 11/05/17 0335  NA 149* 148* 145 140 140  K 3.7 3.7 3.4* 3.6 4.1  CL 112* 113* 108 106 106  CO2 22 27 28 27 27   GLUCOSE 144* 218* 201* 161* 131*  BUN 41* 40* 28* 16 12  CREATININE 1.42* 1.09 0.84 0.61 0.51*  CALCIUM 8.5* 8.6* 8.4* 8.3* 7.9*   GFR: Estimated Creatinine Clearance: 91.4 mL/min (A) (by C-G formula based on SCr of 0.51 mg/dL (L)). Liver Function Tests: Recent Labs  Lab 10/31/17 1048  AST 34  ALT 23  ALKPHOS 51  BILITOT 0.7  PROT 5.4*  ALBUMIN 2.4*   No results for input(s): LIPASE, AMYLASE in the last 168 hours. No results for input(s): AMMONIA in the  last 168 hours. Coagulation Profile: No results for input(s): INR, PROTIME in the last 168 hours. Cardiac Enzymes: No results for input(s): CKTOTAL, CKMB, CKMBINDEX, TROPONINI in the last 168 hours. BNP (last 3 results) No results for input(s): PROBNP in the last 8760 hours. HbA1C: No results for input(s): HGBA1C in the last 72 hours. CBG: No results for input(s): GLUCAP in the last 168 hours. Lipid Profile: No results for input(s): CHOL, HDL, LDLCALC, TRIG, CHOLHDL, LDLDIRECT in the last 72 hours. Thyroid Function Tests: No results for input(s): TSH, T4TOTAL, FREET4, T3FREE, THYROIDAB in the last 72 hours. Anemia Panel: No results for input(s): VITAMINB12, FOLATE, FERRITIN, TIBC, IRON, RETICCTPCT in the last 72 hours. Urine analysis:    Component Value Date/Time   COLORURINE AMBER (A) 10/31/2017 1529   APPEARANCEUR CLOUDY (A) 10/31/2017 1529   LABSPEC 1.027 10/31/2017 1529   PHURINE 5.0  10/31/2017 1529   GLUCOSEU 50 (A) 10/31/2017 1529   HGBUR LARGE (A) 10/31/2017 1529   BILIRUBINUR NEGATIVE 10/31/2017 1529   KETONESUR NEGATIVE 10/31/2017 1529   PROTEINUR 100 (A) 10/31/2017 1529   NITRITE NEGATIVE 10/31/2017 1529   LEUKOCYTESUR NEGATIVE 10/31/2017 1529   Sepsis Labs: @LABRCNTIP (procalcitonin:4,lacticidven:4)  ) Recent Results (from the past 240 hour(s))  Blood Culture (routine x 2)     Status: None (Preliminary result)   Collection Time: 10/31/17 10:48 AM  Result Value Ref Range Status   Specimen Description   Final    BLOOD RIGHT ANTECUBITAL Performed at [facility], [address], [place], [place] [address]    Special Requests   Final    BOTTLES DRAWN AEROBIC AND ANAEROBIC Blood Culture adequate volume Performed at [facility], [address] [place], [place] [address]    Culture  Setup Time   Final    GRAM NEGATIVE RODS ANAEROBIC BOTTLE ONLY CRITICAL RESULT CALLED TO, READ BACK BY AND VERIFIED WITH: E JACKSON,PHARMD AT 1011 11/05/17 BY L BENFIELD  Performed at [facility], [address] [place], [place] [address]    Culture GRAM NEGATIVE RODS  Final   Report Status PENDING  Incomplete  Blood Culture (routine x 2)     Status: None   Collection Time: 10/31/17 10:53 AM  Result Value Ref Range Status   Specimen Description   Final    BLOOD LEFT FOREARM Performed at [facility], [address], [place], [place] [address]    Special Requests   Final    BOTTLES DRAWN AEROBIC AND ANAEROBIC Blood Culture adequate volume Performed at [facility], [address], [place], [place] [address]    Culture   Final    NO GROWTH 5 DAYS Performed at [facility], [address], [place], [place] [address]    Report Status 11/05/2017 FINAL  Final  Urine culture     Status: None   Collection Time: 10/31/17  3:29 PM  Result Value Ref Range Status   Specimen Description    Final    URINE, CATHETERIZED Performed at [facility], [address] [place], [place] [address]    Special Requests   Final    Normal Performed at [facility], [address], [place], [place] [address]    Culture   Final    NO GROWTH Performed at [facility], [address] [place], [place] [address]    Report Status 11/01/2017 FINAL  Final  MRSA PCR Screening  Status: Abnormal   Collection Time: 10/31/17  9:30 PM  Result Value Ref Range Status   MRSA by PCR POSITIVE (A) NEGATIVE Final    Comment:        The GeneXpert MRSA Assay (FDA approved for NASAL specimens only), is one component of a comprehensive MRSA colonization surveillance program. It is not intended to diagnose MRSA infection nor to guide or monitor treatment for MRSA infections. RESULT CALLED TO, READ BACK BY AND VERIFIED WITH: H.RICHARD RN AT [phone] ON 11/01/17 BY S.VANHOORNE Performed at [facility], [address] [place], [place] [id number]       Radiology Studies: No results found.   Scheduled Meds: . Chlorhexidine Gluconate Cloth  6 each Topical Q0600  . citalopram  10 mg Oral Daily  . enoxaparin (LOVENOX) injection  40 mg Subcutaneous Q24H  . feeding supplement (ENSURE ENLIVE)  237 mL Oral TID BM  . multivitamin with minerals  1 tablet Oral Daily  . mupirocin ointment  1 application Nasal BID  . pantoprazole  40 mg Oral Daily  . tetrabenazine  25 mg Oral BID   Continuous Infusions: . cefTRIAXone (ROCEPHIN)  IV Stopped (11/04/17 1257)  . dextrose 5 % and 0.45% NaCl 75 mL/hr at 11/05/17 0429  . metronidazole       LOS: 5 days   Time Spent in minutes   45 minutes  Shaheim Mahar D.O. on 11/05/2017 at 10:47 AM  Between 7am to 7pm - Pager - [phone]  After 7pm go to [email or web address] - password [email or web address]  And look for the night coverage person covering for me after hours  [facility] Office  [phone]

## 2017-11-06 LAB — BASIC METABOLIC PANEL
ANION GAP: 6 (ref 5–15)
BUN: 9 mg/dL (ref 6–20)
CO2: 29 mmol/L (ref 22–32)
Calcium: 7.7 mg/dL — ABNORMAL LOW (ref 8.9–10.3)
Chloride: 105 mmol/L (ref 98–111)
Creatinine, Ser: 0.53 mg/dL — ABNORMAL LOW (ref 0.61–1.24)
GLUCOSE: 125 mg/dL — AB (ref 70–99)
POTASSIUM: 3.7 mmol/L (ref 3.5–5.1)
Sodium: 140 mmol/L (ref 135–145)

## 2017-11-06 LAB — CBC
HEMATOCRIT: 35.1 % — AB (ref 39.0–52.0)
Hemoglobin: 11.6 g/dL — ABNORMAL LOW (ref 13.0–17.0)
MCH: 29.9 pg (ref 26.0–34.0)
MCHC: 33 g/dL (ref 30.0–36.0)
MCV: 90.5 fL (ref 78.0–100.0)
PLATELETS: 407 10*3/uL — AB (ref 150–400)
RBC: 3.88 MIL/uL — AB (ref 4.22–5.81)
RDW: 14 % (ref 11.5–15.5)
WBC: 17.7 10*3/uL — AB (ref 4.0–10.5)

## 2017-11-06 MED ORDER — KETOROLAC TROMETHAMINE 30 MG/ML IJ SOLN
30.0000 mg | Freq: Three times a day (TID) | INTRAMUSCULAR | Status: DC | PRN
  Administered 2017-11-08 – 2017-11-09 (×3): 30 mg via INTRAVENOUS
  Filled 2017-11-06 (×3): qty 1

## 2017-11-06 MED ORDER — OXYCODONE HCL 5 MG PO TABS
5.0000 mg | ORAL_TABLET | ORAL | Status: DC | PRN
  Administered 2017-11-06 – 2017-11-15 (×20): 5 mg via ORAL
  Filled 2017-11-06 (×20): qty 1

## 2017-11-06 MED ORDER — BACITRACIN ZINC 500 UNIT/GM EX OINT
TOPICAL_OINTMENT | Freq: Two times a day (BID) | CUTANEOUS | Status: DC
  Administered 2017-11-06 – 2017-11-08 (×4): via TOPICAL
  Filled 2017-11-06: qty 28.35

## 2017-11-06 NOTE — Progress Notes (Signed)
PROGRESS NOTE    [name]  UJW:[id number] DOB: [birth year] DOA: 10/31/2017 PCP: [name], PA   Brief Narrative:  HPI on 11/01/2017 by Dr. Delrae Sawyers [name] is a 62 y.o. male with medical history significant of Huntington's disease, who follows up with neurology clinic.  His movement disorder has been severe affecting his quality of life and overall health.  He is a resident from the nursing home.  Patient was brought to the hospital due to decreased p.o. intake, in the emergency department he was noted to have a right upper extremity rash.    On direct questioning patient reports severe pain of the right upper extremity, associated with pruritus, no improving or worsening factors, denies any other associated symptoms, symptoms present for last few days.  Unable to get a clear history due to patient's current cognitive dysfunction.   Interim history  Admitted for sepsis secondary to right upper extremity cellulitis and myositis, placed on IV vancomycin and Zosyn and transitioned to ceftriaxone. Blood cultures from 6/24 showed GNR, patient started on flagyl.  Assessment & Plan   Sepsis secondary to right upper extreme the cellulitis/myositis -Patient presented with tachycardia, tachypnea, elevated lactic acid -CT right upper extremity: Moderate circumferential soft tissue swelling about the upper arm, can be seen with cellulitis, venous insufficiency or third spacing.  No abscess.  Ill-defined hypodensity throughout the triceps musculature, may reflect underlying myositis.  No osseous abnormality. -Blood cultures 1/2 from 10/31/2017 +GNR -Placed on IV Zosyn and vancomycin and transitioned to ceftriaxone and flagyl -Orthopedic surgery consulted and appreciated-recommended continuing IV antibiotics for now, no surgery. Reassessed today 6/28 by [name], continue IV antibiotics and transition to PO after the weekend -[name] negative for DVT -Slowly improving -repeat Blood  cultures on 6/29 pending  Acute kidney injury complicated by hypernatremia and hyperchloremia -Resolved -Suspect secondary to sepsis and dehydration, prerenal causes -Creatinine 1.42 on admission, currently 0.53 -Placed on IV fluids -Continue to monitor BMP  Huntington's chorea -Patient does have low functional capacity and is an overall poor health -Continue supportive medical care -Continue citalopram and tetrabenazine  Dyspepsia -Continue famotidine  Normocytic anemia -hemoglobin 11.6, drop from 14 (suspect delusional component as patient is receiving IV fluids) -No previous hemoglobin for comparison -continue to monitor CBC  Leukocytosis  -possibly secondary to the above, will continue to monitor CBC and treat with ceftriaxone and flagyl  Protein calorie malnutrition -Nutrition consulted, continue supplements  DVT Prophylaxis  Lovenox  Code Status: Full  Family Communication: None at bedside  Disposition Plan: Admitted. Pending improvement. Suspect discharge to ALF within the next 3-4 days as patient's infection has been slow to improve.   Consultants Orthopedic surgery  Procedures  Right Upper extremity doppler  Antibiotics   Anti-infectives (From admission, onward)   Start     Dose/Rate Route Frequency Ordered Stop   11/05/17 1200  metroNIDAZOLE (FLAGYL) IVPB 500 mg     500 mg 100 mL/hr over 60 Minutes Intravenous Every 8 hours 11/05/17 1041     11/02/17 1200  cefTRIAXone (ROCEPHIN) 1 g in sodium chloride 0.9 % 100 mL IVPB     1 g 200 mL/hr over 30 Minutes Intravenous Every 24 hours 11/02/17 0911     11/01/17 2200  vancomycin (VANCOCIN) 1,250 mg in sodium chloride 0.9 % 250 mL IVPB  Status:  Discontinued     1,250 mg 166.7 mL/hr over 90 Minutes Intravenous Every 48 hours 10/31/17 2142 11/02/17 0911   11/01/17 0400  piperacillin-tazobactam (ZOSYN) IVPB  3.375 g  Status:  Discontinued     3.375 g 12.5 mL/hr over 240 Minutes Intravenous Every 8 hours 10/31/17  2142 11/02/17 0911   10/31/17 2130  piperacillin-tazobactam (ZOSYN) IVPB 3.375 g  Status:  Discontinued     3.375 g 100 mL/hr over 30 Minutes Intravenous  Once 10/31/17 2115 10/31/17 2115   10/31/17 2130  vancomycin (VANCOCIN) IVPB 1000 mg/200 mL premix  Status:  Discontinued     1,000 mg 200 mL/hr over 60 Minutes Intravenous  Once 10/31/17 2115 10/31/17 2115   10/31/17 1815  piperacillin-tazobactam (ZOSYN) IVPB 3.375 g     3.375 g 100 mL/hr over 30 Minutes Intravenous NOW 10/31/17 1812 10/31/17 2350   10/31/17 1100  piperacillin-tazobactam (ZOSYN) IVPB 3.375 g     3.375 g 100 mL/hr over 30 Minutes Intravenous  Once 10/31/17 1049 10/31/17 1152   10/31/17 1100  vancomycin (VANCOCIN) IVPB 1000 mg/200 mL premix     1,000 mg 200 mL/hr over 60 Minutes Intravenous  Once 10/31/17 1049 10/31/17 1220      Subjective:   [name] seen and examined today. Complains of pain everywhere. Denies current chest pain, or shortness of breath. Complains of right arm pain.  Objective:   Vitals:   11/05/17 0500 11/05/17 1320 11/05/17 2100 11/06/17 0455  BP: 120/68 (!) 143/106 128/74 134/76  Pulse: 70 61 74 70  Resp: 12 16 14 18   Temp: 98 F (36.7 C) 98 F (36.7 C) 98.2 F (36.8 C) 98.4 F (36.9 C)  TempSrc: Axillary Oral Axillary Axillary  SpO2: 100% 94% 97% 99%  Weight:      Height:        Intake/Output Summary (Last 24 hours) at 11/06/2017 1130 Last data filed at 11/06/2017 0930 Gross per 24 hour  Intake 2222.5 ml  Output 1225 ml  Net 997.5 ml   Filed Weights   11/01/17 1425  Weight: 65.8 kg (145 lb)   Exam  General: Well developed, chronically ill appearing, NAD  HEENT: NCAT, mucous membranes moist.   Neck: Supple  Cardiovascular: S1 S2 auscultated, no rubs, murmurs or gallops. Regular rate and rhythm.  Respiratory: Clear to auscultation bilaterally with equal chest rise  Abdomen: Soft, nontender, nondistended, + bowel sounds  Extremities: warm dry without cyanosis  clubbing. RUE edema and erythema with purulent wound  Neuro: Awake and alert, heard of hearing. Moves all extremities and answers simple questions.   Skin: RUE with erythema  Psych: pleasant, appropriate   Data Reviewed: I have personally reviewed following labs and imaging studies  CBC: Recent Labs  Lab 10/31/17 1048 11/01/17 0417 11/02/17 0358 11/03/17 0355 11/06/17 0520  WBC 10.6* 6.2 7.2 9.1 17.7*  NEUTROABS 8.3  --  6.1  --   --   HGB 14.6 14.0 10.7* 10.4* 11.6*  HCT 43.5 44.1 33.1* 31.9* 35.1*  MCV 94.0 91.9 91.9 92.5 90.5  PLT 417* 225 219 172 407*   Basic Metabolic Panel: Recent Labs  Lab 11/01/17 0417 11/02/17 0358 11/03/17 0355 11/05/17 0335 11/06/17 0520  NA 148* 145 140 140 140  K 3.7 3.4* 3.6 4.1 3.7  CL 113* 108 106 106 105  CO2 27 28 27 27 29   GLUCOSE 218* 201* 161* 131* 125*  BUN 40* 28* 16 12 9   CREATININE 1.09 0.84 0.61 0.51* 0.53*  CALCIUM 8.6* 8.4* 8.3* 7.9* 7.7*   GFR: Estimated Creatinine Clearance: 91.4 mL/min (A) (by C-G formula based on SCr of 0.53 mg/dL (L)). Liver Function Tests: Recent Labs  Lab 10/31/17 1048  AST 34  ALT 23  ALKPHOS 51  BILITOT 0.7  PROT 5.4*  ALBUMIN 2.4*   No results for input(s): LIPASE, AMYLASE in the last 168 hours. No results for input(s): AMMONIA in the last 168 hours. Coagulation Profile: No results for input(s): INR, PROTIME in the last 168 hours. Cardiac Enzymes: No results for input(s): CKTOTAL, CKMB, CKMBINDEX, TROPONINI in the last 168 hours. BNP (last 3 results) No results for input(s): PROBNP in the last 8760 hours. HbA1C: No results for input(s): HGBA1C in the last 72 hours. CBG: No results for input(s): GLUCAP in the last 168 hours. Lipid Profile: No results for input(s): CHOL, HDL, LDLCALC, TRIG, CHOLHDL, LDLDIRECT in the last 72 hours. Thyroid Function Tests: No results for input(s): TSH, T4TOTAL, FREET4, T3FREE, THYROIDAB in the last 72 hours. Anemia Panel: No results for  input(s): VITAMINB12, FOLATE, FERRITIN, TIBC, IRON, RETICCTPCT in the last 72 hours. Urine analysis:    Component Value Date/Time   COLORURINE AMBER (A) 10/31/2017 1529   APPEARANCEUR CLOUDY (A) 10/31/2017 1529   LABSPEC 1.027 10/31/2017 1529   PHURINE 5.0 10/31/2017 1529   GLUCOSEU 50 (A) 10/31/2017 1529   HGBUR LARGE (A) 10/31/2017 1529   BILIRUBINUR NEGATIVE 10/31/2017 1529   KETONESUR NEGATIVE 10/31/2017 1529   PROTEINUR 100 (A) 10/31/2017 1529   NITRITE NEGATIVE 10/31/2017 1529   LEUKOCYTESUR NEGATIVE 10/31/2017 1529   Sepsis Labs: @LABRCNTIP (procalcitonin:4,lacticidven:4)  ) Recent Results (from the past 240 hour(s))  Blood Culture (routine x 2)     Status: Abnormal (Preliminary result)   Collection Time: 10/31/17 10:48 AM  Result Value Ref Range Status   Specimen Description   Final    BLOOD RIGHT ANTECUBITAL Performed at [facility], [address] [place], [place] [address]    Special Requests   Final    BOTTLES DRAWN AEROBIC AND ANAEROBIC Blood Culture adequate volume Performed at [facility], [address], [place], [place] [address]    Culture  Setup Time   Final    GRAM NEGATIVE RODS ANAEROBIC BOTTLE ONLY CRITICAL RESULT CALLED TO, READ BACK BY AND VERIFIED WITH: E JACKSON,PHARMD AT 1011 11/05/17 BY L BENFIELD     Culture (A)  Final    ANAEROBIC GRAM NEGATIVE ROD BETA LACTAMASE NEGATIVE IDENTIFICATION TO FOLLOW Performed at [facility], [address] [place], [place] [address]    Report Status PENDING  Incomplete  Blood Culture (routine x 2)     Status: None   Collection Time: 10/31/17 10:53 AM  Result Value Ref Range Status   Specimen Description   Final    BLOOD LEFT FOREARM Performed at [facility], [address], [place], [place] [address]    Special Requests   Final    BOTTLES DRAWN AEROBIC AND ANAEROBIC Blood Culture adequate volume Performed at [facility], [address], [place], [place] [address]    Culture   Final    NO GROWTH 5 DAYS Performed at [facility], [address], [place], [place] [address]    Report Status 11/05/2017 FINAL  Final  Urine culture     Status: None   Collection Time: 10/31/17  3:29 PM  Result Value Ref Range Status   Specimen Description   Final    URINE, CATHETERIZED Performed at [facility], [address], [place], [place] [address]    Special Requests   Final    Normal Performed  at [facility], [address], [place], [place] [address]    Culture   Final    NO GROWTH Performed at [facility], [address], [place], [place] [address]    Report Status 11/01/2017 FINAL  Final  MRSA PCR Screening     Status: Abnormal   Collection Time: 10/31/17  9:30 PM  Result Value Ref Range Status   MRSA by PCR POSITIVE (A) NEGATIVE Final    Comment:        The GeneXpert MRSA Assay (FDA approved for NASAL specimens only), is one component of a comprehensive MRSA colonization surveillance program. It is not intended to diagnose MRSA infection nor to guide or monitor treatment for MRSA infections. RESULT CALLED TO, READ BACK BY AND VERIFIED WITH: H.RICHARD RN AT [phone] ON 11/01/17 BY S.VANHOORNE Performed at [facility], [address], [place], [place] [address]       Radiology Studies: No results found.   Scheduled Meds: . bacitracin   Topical BID  . Chlorhexidine Gluconate Cloth  6 each Topical Q0600  . citalopram  10 mg Oral Daily  . enoxaparin (LOVENOX) injection  40 mg Subcutaneous Q24H  . feeding supplement (ENSURE ENLIVE)  237 mL Oral TID BM  . multivitamin with minerals  1 tablet Oral Daily  . mupirocin ointment  1 application Nasal BID  . pantoprazole  40 mg Oral Daily  . tetrabenazine  25 mg Oral BID   Continuous Infusions: . cefTRIAXone (ROCEPHIN)  IV Stopped (11/05/17 1437)  . dextrose 5 % and 0.45%  NaCl 75 mL/hr at 11/06/17 1003  . metronidazole Stopped (11/06/17 8295)     LOS: 6 days   Time Spent in minutes   30 minutes  Eliannah Hinde D.O. on 11/06/2017 at 11:30 AM  Between 7am to 7pm - Pager - [phone]  After 7pm go to [email or web address] - password [email or web address]  And look for the night coverage person covering for me after hours  [facility] Office  [phone]

## 2017-11-07 ENCOUNTER — Inpatient Hospital Stay (HOSPITAL_COMMUNITY): Payer: Medicare Other

## 2017-11-07 LAB — BASIC METABOLIC PANEL
ANION GAP: 9 (ref 5–15)
BUN: 9 mg/dL (ref 6–20)
CALCIUM: 7.6 mg/dL — AB (ref 8.9–10.3)
CHLORIDE: 102 mmol/L (ref 98–111)
CO2: 27 mmol/L (ref 22–32)
Creatinine, Ser: 0.52 mg/dL — ABNORMAL LOW (ref 0.61–1.24)
GFR calc Af Amer: >60 mL/min (ref >60)
GFR calc non Af Amer: >60 mL/min (ref >60)
Glucose, Bld: 119 mg/dL — ABNORMAL HIGH (ref 70–99)
Potassium: 3.7 mmol/L (ref 3.5–5.1)
Sodium: 138 mmol/L (ref 135–145)

## 2017-11-07 LAB — CBC
HEMATOCRIT: 33.3 % — AB (ref 39.0–52.0)
HEMOGLOBIN: 10.8 g/dL — AB (ref 13.0–17.0)
MCH: 30.2 pg (ref 26.0–34.0)
MCHC: 32.4 g/dL (ref 30.0–36.0)
MCV: 93 fL (ref 78.0–100.0)
Platelets: 519 10*3/uL — ABNORMAL HIGH (ref 150–400)
RBC: 3.58 MIL/uL — ABNORMAL LOW (ref 4.22–5.81)
RDW: 14.2 % (ref 11.5–15.5)
WBC: 16.9 10*3/uL — AB (ref 4.0–10.5)

## 2017-11-07 LAB — CULTURE, BLOOD (ROUTINE X 2): Special Requests: ADEQUATE

## 2017-11-07 MED ORDER — VANCOMYCIN HCL 10 G IV SOLR
1250.0000 mg | Freq: Once | INTRAVENOUS | Status: AC
  Administered 2017-11-07: 1250 mg via INTRAVENOUS
  Filled 2017-11-07: qty 1250

## 2017-11-07 MED ORDER — LORAZEPAM 2 MG/ML IJ SOLN
2.0000 mg | Freq: Once | INTRAMUSCULAR | Status: AC
  Administered 2017-11-07: 2 mg via INTRAVENOUS
  Filled 2017-11-07: qty 1

## 2017-11-07 MED ORDER — IOPAMIDOL (ISOVUE-300) INJECTION 61%
INTRAVENOUS | Status: AC
  Filled 2017-11-07: qty 100

## 2017-11-07 MED ORDER — VANCOMYCIN HCL IN DEXTROSE 750-5 MG/150ML-% IV SOLN
750.0000 mg | Freq: Two times a day (BID) | INTRAVENOUS | Status: DC
  Administered 2017-11-08 – 2017-11-10 (×5): 750 mg via INTRAVENOUS
  Filled 2017-11-07 (×8): qty 150

## 2017-11-07 MED ORDER — IOPAMIDOL (ISOVUE-300) INJECTION 61%
100.0000 mL | Freq: Once | INTRAVENOUS | Status: AC | PRN
  Administered 2017-11-07: 100 mL via INTRAVENOUS

## 2017-11-07 NOTE — Progress Notes (Addendum)
PROGRESS NOTE    Samuel Lambert  YNW:[id number] DOB: Feb 28, 1956 DOA: 10/31/2017 PCP: [name], PA   Brief Narrative:  HPI on 11/01/2017 by Dr. Delrae Sawyers [name] is a 62 y.o. male with medical history significant of Huntington's disease, who follows up with neurology clinic.  His movement disorder has been severe affecting his quality of life and overall health.  He is a resident from the nursing home.  Patient was brought to the hospital due to decreased p.o. intake, in the emergency department he was noted to have a right upper extremity rash.    On direct questioning patient reports severe pain of the right upper extremity, associated with pruritus, no improving or worsening factors, denies any other associated symptoms, symptoms present for last few days.  Unable to get a clear history due to patient's current cognitive dysfunction.   Interim history  Admitted for sepsis secondary to right upper extremity cellulitis and myositis, placed on IV vancomycin and Zosyn and transitioned to ceftriaxone. Blood cultures from 6/24 showed GNR, patient started on flagyl.  Assessment & Plan   Sepsis secondary to right upper extreme the cellulitis/myositis -Patient presented with tachycardia, tachypnea, elevated lactic acid -CT right upper extremity: Moderate circumferential soft tissue swelling about the upper arm, can be seen with cellulitis, venous insufficiency or third spacing.  No abscess.  Ill-defined hypodensity throughout the triceps musculature, may reflect underlying myositis.  No osseous abnormality. -Blood cultures 1/2 from 10/31/2017 +GNR -Placed on IV Zosyn and vancomycin and transitioned to ceftriaxone and flagyl -Orthopedic surgery consulted and appreciated-recommended continuing IV antibiotics for now, no surgery. Reassessed today 6/28 by [name], continue IV antibiotics and transition to PO after the weekend -[name] negative for DVT -Slowly improving -repeat Blood  cultures on 6/29 show no growth to date -Will obtain wound culture- but not the most reliable -will add Vancomycin given the amount of drainage and appearance -discussed with [name], felt that a repeat CT scan may be needed in the next day or so to make sure there is no development of an abscess  Acute kidney injury complicated by hypernatremia and hyperchloremia -Resolved -Suspect secondary to sepsis and dehydration, prerenal causes -Creatinine 1.42 on admission, currently 0.52 -Placed on IV fluids -Continue to monitor BMP  Huntington's chorea -Patient does have low functional capacity and is an overall poor health -Continue supportive medical care -Continue citalopram and tetrabenazine  Dyspepsia -Continue famotidine  Normocytic anemia -hemoglobin 10.8, drop from 14 (suspect delusional component as patient is receiving IV fluids) -No previous hemoglobin for comparison -continue to monitor CBC  Leukocytosis  -possibly secondary to the above,  WBC mildly improved 16.9 -vancomycin has been added  Protein calorie malnutrition -Nutrition consulted, continue supplements  DVT Prophylaxis  Lovenox  Code Status: Full  Family Communication: None at bedside  Disposition Plan: Admitted. Pending improvement. Suspect discharge to ALF within the next 3-4 days as patient's infection has been slow to improve.   Consultants Orthopedic surgery  Procedures  Right Upper extremity doppler  Antibiotics   Anti-infectives (From admission, onward)   Start     Dose/Rate Route Frequency Ordered Stop   11/08/17 0000  vancomycin (VANCOCIN) IVPB 750 mg/150 ml premix     750 mg 150 mL/hr over 60 Minutes Intravenous Every 12 hours 11/07/17 0943     11/07/17 1030  vancomycin (VANCOCIN) 1,250 mg in sodium chloride 0.9 % 250 mL IVPB     1,250 mg 166.7 mL/hr over 90 Minutes Intravenous  Once 11/07/17  6045     11/05/17 1200  metroNIDAZOLE (FLAGYL) IVPB 500 mg     500 mg 100 mL/hr over 60  Minutes Intravenous Every 8 hours 11/05/17 1041     11/02/17 1200  cefTRIAXone (ROCEPHIN) 1 g in sodium chloride 0.9 % 100 mL IVPB     1 g 200 mL/hr over 30 Minutes Intravenous Every 24 hours 11/02/17 0911     11/01/17 2200  vancomycin (VANCOCIN) 1,250 mg in sodium chloride 0.9 % 250 mL IVPB  Status:  Discontinued     1,250 mg 166.7 mL/hr over 90 Minutes Intravenous Every 48 hours 10/31/17 2142 11/02/17 0911   11/01/17 0400  piperacillin-tazobactam (ZOSYN) IVPB 3.375 g  Status:  Discontinued     3.375 g 12.5 mL/hr over 240 Minutes Intravenous Every 8 hours 10/31/17 2142 11/02/17 0911   10/31/17 2130  piperacillin-tazobactam (ZOSYN) IVPB 3.375 g  Status:  Discontinued     3.375 g 100 mL/hr over 30 Minutes Intravenous  Once 10/31/17 2115 10/31/17 2115   10/31/17 2130  vancomycin (VANCOCIN) IVPB 1000 mg/200 mL premix  Status:  Discontinued     1,000 mg 200 mL/hr over 60 Minutes Intravenous  Once 10/31/17 2115 10/31/17 2115   10/31/17 1815  piperacillin-tazobactam (ZOSYN) IVPB 3.375 g     3.375 g 100 mL/hr over 30 Minutes Intravenous NOW 10/31/17 1812 10/31/17 2350   10/31/17 1100  piperacillin-tazobactam (ZOSYN) IVPB 3.375 g     3.375 g 100 mL/hr over 30 Minutes Intravenous  Once 10/31/17 1049 10/31/17 1152   10/31/17 1100  vancomycin (VANCOCIN) IVPB 1000 mg/200 mL premix     1,000 mg 200 mL/hr over 60 Minutes Intravenous  Once 10/31/17 1049 10/31/17 1220      Subjective:   Samuel Lambert seen and examined today. Continues to complain of pain in the right arm. Denies pain any where else at this time.   Objective:   Vitals:   11/06/17 0455 11/06/17 1414 11/06/17 2046 11/07/17 0459  BP: 134/76 130/74 122/69 115/81  Pulse: 70 72 85 78  Resp: 18 16 18 19   Temp: 98.4 F (36.9 C) 98.4 F (36.9 C) 98.7 F (37.1 C) 97.6 F (36.4 C)  TempSrc: Axillary Axillary Oral Oral  SpO2: 99% 99% 97% 97%  Weight:      Height:        Intake/Output Summary (Last 24 hours) at 11/07/2017  1026 Last data filed at 11/07/2017 0502 Gross per 24 hour  Intake 2233.75 ml  Output 1145 ml  Net 1088.75 ml   Filed Weights   11/01/17 1425  Weight: 65.8 kg (145 lb)   Exam  General: Well developed, chronically ill appearing, NAD  HEENT: NCAT, mucous membranes moist.   Neck: Supple  Cardiovascular: S1 S2 auscultated, RRR, no murmur  Respiratory: Clear to auscultation bilaterally with equal chest rise  Abdomen: Soft, nontender, nondistended, + bowel sounds  Extremities: warm dry without cyanosis clubbing or edema of lower ext. RUE with erythema and edema  Neuro: Awake and alert, moves all extremities, and answers simple questions  Psych: pleasant  Data Reviewed: I have personally reviewed following labs and imaging studies  CBC: Recent Labs  Lab 10/31/17 1048 11/01/17 0417 11/02/17 0358 11/03/17 0355 11/06/17 0520 11/07/17 0542  WBC 10.6* 6.2 7.2 9.1 17.7* 16.9*  NEUTROABS 8.3  --  6.1  --   --   --   HGB 14.6 14.0 10.7* 10.4* 11.6* 10.8*  HCT 43.5 44.1 33.1* 31.9* 35.1* 33.3*  MCV 94.0  91.9 91.9 92.5 90.5 93.0  PLT 417* 225 219 172 407* 519*   Basic Metabolic Panel: Recent Labs  Lab 11/02/17 0358 11/03/17 0355 11/05/17 0335 11/06/17 0520 11/07/17 0542  NA 145 140 140 140 138  K 3.4* 3.6 4.1 3.7 3.7  CL 108 106 106 105 102  CO2 28 27 27 29 27   GLUCOSE 201* 161* 131* 125* 119*  BUN 28* 16 12 9 9   CREATININE 0.84 0.61 0.51* 0.53* 0.52*  CALCIUM 8.4* 8.3* 7.9* 7.7* 7.6*   GFR: Estimated Creatinine Clearance: 91.4 mL/min (A) (by C-G formula based on SCr of 0.52 mg/dL (L)). Liver Function Tests: Recent Labs  Lab 10/31/17 1048  AST 34  ALT 23  ALKPHOS 51  BILITOT 0.7  PROT 5.4*  ALBUMIN 2.4*   No results for input(s): LIPASE, AMYLASE in the last 168 hours. No results for input(s): AMMONIA in the last 168 hours. Coagulation Profile: No results for input(s): INR, PROTIME in the last 168 hours. Cardiac Enzymes: No results for input(s): CKTOTAL,  CKMB, CKMBINDEX, TROPONINI in the last 168 hours. BNP (last 3 results) No results for input(s): PROBNP in the last 8760 hours. HbA1C: No results for input(s): HGBA1C in the last 72 hours. CBG: No results for input(s): GLUCAP in the last 168 hours. Lipid Profile: No results for input(s): CHOL, HDL, LDLCALC, TRIG, CHOLHDL, LDLDIRECT in the last 72 hours. Thyroid Function Tests: No results for input(s): TSH, T4TOTAL, FREET4, T3FREE, THYROIDAB in the last 72 hours. Anemia Panel: No results for input(s): VITAMINB12, FOLATE, FERRITIN, TIBC, IRON, RETICCTPCT in the last 72 hours. Urine analysis:    Component Value Date/Time   COLORURINE AMBER (A) 10/31/2017 1529   APPEARANCEUR CLOUDY (A) 10/31/2017 1529   LABSPEC 1.027 10/31/2017 1529   PHURINE 5.0 10/31/2017 1529   GLUCOSEU 50 (A) 10/31/2017 1529   HGBUR LARGE (A) 10/31/2017 1529   BILIRUBINUR NEGATIVE 10/31/2017 1529   KETONESUR NEGATIVE 10/31/2017 1529   PROTEINUR 100 (A) 10/31/2017 1529   NITRITE NEGATIVE 10/31/2017 1529   LEUKOCYTESUR NEGATIVE 10/31/2017 1529   Sepsis Labs: @LABRCNTIP (procalcitonin:4,lacticidven:4)  ) Recent Results (from the past 240 hour(s))  Blood Culture (routine x 2)     Status: Abnormal (Preliminary result)   Collection Time: 10/31/17 10:48 AM  Result Value Ref Range Status   Specimen Description   Final    BLOOD RIGHT ANTECUBITAL Performed at [facility], [address] [place], [place] [address]    Special Requests   Final    BOTTLES DRAWN AEROBIC AND ANAEROBIC Blood Culture adequate volume Performed at [facility], [address] [place], [place] [address]    Culture  Setup Time   Final    GRAM NEGATIVE RODS ANAEROBIC BOTTLE ONLY CRITICAL RESULT CALLED TO, READ BACK BY AND VERIFIED WITH: E JACKSON,PHARMD AT 1011 11/05/17 BY L BENFIELD     Culture (A)  Final    ANAEROBIC GRAM NEGATIVE ROD BETA LACTAMASE NEGATIVE IDENTIFICATION TO FOLLOW Performed at  [facility], [address] [place], [place] [address]    Report Status PENDING  Incomplete  Blood Culture (routine x 2)     Status: None   Collection Time: 10/31/17 10:53 AM  Result Value Ref Range Status   Specimen Description   Final    BLOOD LEFT FOREARM Performed at [facility] [place] [facility], [address] [place], [place] [address]    Special Requests   Final    BOTTLES DRAWN AEROBIC AND ANAEROBIC Blood  Culture adequate volume Performed at [facility], [address] [place], [place] [address]    Culture   Final    NO GROWTH 5 DAYS Performed at [facility], [address] [place], [place] [address]    Report Status 11/05/2017 FINAL  Final  Urine culture     Status: None   Collection Time: 10/31/17  3:29 PM  Result Value Ref Range Status   Specimen Description   Final    URINE, CATHETERIZED Performed at [facility], [address], [place], [place] [address]    Special Requests   Final    Normal Performed at [facility], [address], [place], [place] [address]    Culture   Final    NO GROWTH Performed at [facility], [address], [place], [place] [address]    Report Status 11/01/2017 FINAL  Final  MRSA PCR Screening     Status: Abnormal   Collection Time: 10/31/17  9:30 PM  Result Value Ref Range Status   MRSA by PCR POSITIVE (A) NEGATIVE Final    Comment:        The GeneXpert MRSA Assay (FDA approved for NASAL specimens only), is one component of a comprehensive MRSA colonization surveillance program. It is not intended to diagnose MRSA infection nor to guide or monitor treatment for MRSA infections. RESULT CALLED TO, READ BACK BY AND VERIFIED WITH: H.RICHARD RN AT [phone] ON 11/01/17 BY S.VANHOORNE Performed at [facility], [address], [place], [place] [address]   Culture, blood (routine x 2)     Status: None (Preliminary result)    Collection Time: 11/05/17 11:21 AM  Result Value Ref Range Status   Specimen Description BLOOD LEFT ARM  Final   Special Requests   Final    BOTTLES DRAWN AEROBIC ONLY Blood Culture adequate volume Performed at [facility], [address] [place], [place] [address]    Culture   Final    NO GROWTH < 24 HOURS Performed at [facility], [address] [place], [place] [address]    Report Status PENDING  Incomplete  Culture, blood (routine x 2)     Status: None (Preliminary result)   Collection Time: 11/05/17 11:22 AM  Result Value Ref Range Status   Specimen Description BLOOD LEFT HAND  Final   Special Requests   Final    BOTTLES DRAWN AEROBIC ONLY Blood Culture results may not be optimal due to an inadequate volume of blood received in culture bottles Performed at [facility], [address], [place], [place] [address]    Culture   Final    NO GROWTH < 24 HOURS Performed at [facility], [address] [place], [place] [address]    Report Status PENDING  Incomplete      Radiology Studies: No results found.   Scheduled Meds: . bacitracin   Topical BID  . Chlorhexidine Gluconate Cloth  6 each Topical Q0600  . citalopram  10 mg Oral Daily  . enoxaparin (LOVENOX) injection  40 mg Subcutaneous Q24H  . feeding supplement (ENSURE ENLIVE)  237 mL Oral TID BM  . multivitamin with minerals  1 tablet Oral Daily  . mupirocin ointment  1 application Nasal BID  . pantoprazole  40 mg Oral Daily  . tetrabenazine  25 mg Oral BID   Continuous Infusions: . cefTRIAXone (ROCEPHIN)  IV Stopped (11/06/17 1340)  .  dextrose 5 % and 0.45% NaCl 75 mL/hr at 11/07/17 0203  . metronidazole Stopped (11/07/17 1610)  . vancomycin 1,250 mg (11/07/17 0955)  . [START ON 11/08/2017] vancomycin       LOS: 7 days   Time Spent in minutes   45 minutes  Arshan Jabs D.O. on 11/07/2017 at 10:26 AM  Between 7am to 7pm - Pager - [phone]  After  7pm go to [email or web address] - password [email or web address]  And look for the night coverage person covering for me after hours  [facility] Office  [phone]

## 2017-11-07 NOTE — Progress Notes (Signed)
Pharmacy Antibiotic Note  Samuel Lambert is a 62 y.o. male with Huntington's disease admitted on 10/31/2017 with decreased PO intake and right upper extremity rash.  CT showed soft tissue swelling, no abscess; ill-defined hypodensity throughout the triceps musculature, may reflect underlying myositis.  No osseous abnormality. Orthopedic surgery consulted and recommended continuing IV antibiotics for now, no surgery. Reassessed today 6/28, continue IV antibiotics and transition to PO after the weekend.  Has been on ceftriaxone and flagyl. Today, 11/07/2017, Pharmacy has been consulted for vancomycin dosing for MRSA coverage since does not appear to be improving.  Ortho will evaluate patient again today.  Plan:  Vancomycin 1250mg  IV x 1, then 750mg  IV q12h for estimated AUC 437  Check vancomycin levels at steady state, goal AUC 400-500  Continue ceftriaxone/flagyl per MD  Follow up renal function & cultures  Height: 6\' 3"  (190.5 cm)(per patient report; unable to stand) Weight: 145 lb (65.8 kg) IBW/kg (Calculated) : 84.5  Temp (24hrs), Avg:98.2 F (36.8 C), Min:97.6 F (36.4 C), Max:98.7 F (37.1 C)  Recent Labs  Lab 10/31/17 1101 10/31/17 1500 11/01/17 0417 11/02/17 0358 11/03/17 0355 11/05/17 0335 11/06/17 0520 11/07/17 0542  WBC  --   --  6.2 7.2 9.1  --  17.7* 16.9*  CREATININE  --   --  1.09 0.84 0.61 0.51* 0.53* 0.52*  LATICACIDVEN 3.43* 2.27*  --   --   --   --   --   --     Estimated Creatinine Clearance: 91.4 mL/min (A) (by C-G formula based on SCr of 0.52 mg/dL (L)).    No Known Allergies  Antimicrobials this admission: 6/24 Vanc  >> 6/26, resume 7/1 >> 6/24 Zosyn >> 6/26 6/26 Ceftriaxone >>  6/29 metronidazole >>   Levels/Dose adjustments: 7/1: Vancomycin 1250mg  x 1, then 750mg  q12h - AUC 437, SCr rounded 0.8, TBW used since < IBW   Microbiology results: 6/24 BCx: GNR BL(-)1/2 (ANAEROBIC bottle only) 6/24 UCx:  NGF 6/24 MRSA PCR: positive 6/29 BCx:  ngtd  Thank you for allowing pharmacy to be a part of this patient's care.  Loralee PacasErin Coyle Stordahl, PharmD, BCPS Pager: [phone] 11/07/2017 9:36 AM

## 2017-11-07 NOTE — Progress Notes (Signed)
Patient ID: Samuel Lambert, male   DOB: 10/30/1955, 62 y.o.   MRN: [id number]  Acknowledges feeling a little better  AFVSS  Supine this am unfortunately with active movement disorder affecting him  Right upper arm Drainage from open superficial wound site dressed Erythema on arm significantly improved  Plan: As discussed with primary team this am would be to consider repeat CT scan to evaluate for any defined abscess or improvement from initial study May consult ID for assistance with PO abx choice due to gram negative rods found in blood culture (also repeated)  Otherwise wound start wet to dry dressing changes to be done at home BID with nursing or other care at home site  Will continue to follow until discharge

## 2017-11-08 ENCOUNTER — Encounter (HOSPITAL_COMMUNITY): Payer: Self-pay | Admitting: *Deleted

## 2017-11-08 ENCOUNTER — Inpatient Hospital Stay (HOSPITAL_COMMUNITY): Payer: Medicare Other | Admitting: Certified Registered Nurse Anesthetist

## 2017-11-08 ENCOUNTER — Encounter (HOSPITAL_COMMUNITY)
Admission: EM | Disposition: A | Discharge status: Home / Self Care | Payer: Self-pay | Source: Home / Self Care | Attending: Internal Medicine

## 2017-11-08 HISTORY — PX: INCISION AND DRAINAGE ABSCESS: SHX5864

## 2017-11-08 LAB — BASIC METABOLIC PANEL
Anion gap: 7 (ref 5–15)
BUN: 7 mg/dL (ref 6–20)
CALCIUM: 7.5 mg/dL — AB (ref 8.9–10.3)
CO2: 30 mmol/L (ref 22–32)
CREATININE: 0.53 mg/dL — AB (ref 0.61–1.24)
Chloride: 102 mmol/L (ref 98–111)
GFR calc Af Amer: >60 mL/min (ref >60)
Glucose, Bld: 124 mg/dL — ABNORMAL HIGH (ref 70–99)
Potassium: 3.3 mmol/L — ABNORMAL LOW (ref 3.5–5.1)
SODIUM: 139 mmol/L (ref 135–145)

## 2017-11-08 LAB — CBC
HEMATOCRIT: 34.6 % — AB (ref 39.0–52.0)
HEMOGLOBIN: 11.2 g/dL — AB (ref 13.0–17.0)
MCH: 29.9 pg (ref 26.0–34.0)
MCHC: 32.4 g/dL (ref 30.0–36.0)
MCV: 92.5 fL (ref 78.0–100.0)
PLATELETS: 598 10*3/uL — AB (ref 150–400)
RBC: 3.74 MIL/uL — AB (ref 4.22–5.81)
RDW: 14.2 % (ref 11.5–15.5)
WBC: 16 10*3/uL — ABNORMAL HIGH (ref 4.0–10.5)

## 2017-11-08 LAB — MRSA PCR SCREENING: MRSA by PCR: POSITIVE — AB

## 2017-11-08 SURGERY — INCISION AND DRAINAGE, ABSCESS
Anesthesia: General | Site: Shoulder | Laterality: Right

## 2017-11-08 MED ORDER — DOCUSATE SODIUM 100 MG PO CAPS
100.0000 mg | ORAL_CAPSULE | Freq: Two times a day (BID) | ORAL | Status: DC
  Administered 2017-11-09 – 2017-11-15 (×13): 100 mg via ORAL
  Filled 2017-11-08 (×13): qty 1

## 2017-11-08 MED ORDER — METOCLOPRAMIDE HCL 5 MG PO TABS: 5.0000 mg | ORAL_TABLET | Freq: Three times a day (TID) | ORAL | Status: DC | PRN

## 2017-11-08 MED ORDER — POLYMYXIN B SULFATE 500000 UNITS IJ SOLR
INTRAMUSCULAR | Status: DC | PRN
  Administered 2017-11-08: 500 mL

## 2017-11-08 MED ORDER — ONDANSETRON HCL 4 MG/2ML IJ SOLN
INTRAMUSCULAR | Status: AC
  Filled 2017-11-08: qty 2

## 2017-11-08 MED ORDER — ONDANSETRON HCL 4 MG/2ML IJ SOLN: 4.0000 mg | Freq: Four times a day (QID) | INTRAMUSCULAR | Status: DC | PRN

## 2017-11-08 MED ORDER — ONDANSETRON HCL 4 MG/2ML IJ SOLN
INTRAMUSCULAR | Status: DC | PRN
  Administered 2017-11-08: 4 mg via INTRAVENOUS

## 2017-11-08 MED ORDER — METOCLOPRAMIDE HCL 5 MG/ML IJ SOLN: 5.0000 mg | Freq: Three times a day (TID) | INTRAMUSCULAR | Status: DC | PRN

## 2017-11-08 MED ORDER — SODIUM CHLORIDE 0.9 % IV SOLN
INTRAVENOUS | Status: AC
  Filled 2017-11-08: qty 500000

## 2017-11-08 MED ORDER — CHLORHEXIDINE GLUCONATE 4 % EX LIQD
60.0000 mL | Freq: Once | CUTANEOUS | Status: AC
  Filled 2017-11-08: qty 60

## 2017-11-08 MED ORDER — EPHEDRINE SULFATE-NACL 50-0.9 MG/10ML-% IV SOSY
PREFILLED_SYRINGE | INTRAVENOUS | Status: DC | PRN
  Administered 2017-11-08: 10 mg via INTRAVENOUS

## 2017-11-08 MED ORDER — HALOPERIDOL LACTATE 5 MG/ML IJ SOLN
1.0000 mg | Freq: Four times a day (QID) | INTRAMUSCULAR | Status: DC | PRN
  Administered 2017-11-09: 1 mg via INTRAVENOUS
  Filled 2017-11-08 (×3): qty 1

## 2017-11-08 MED ORDER — POTASSIUM CHLORIDE IN NACL 20-0.9 MEQ/L-% IV SOLN
INTRAVENOUS | Status: DC
  Administered 2017-11-08: 23:00:00 via INTRAVENOUS
  Filled 2017-11-08: qty 1000

## 2017-11-08 MED ORDER — ONDANSETRON HCL 4 MG PO TABS: 4.0000 mg | ORAL_TABLET | Freq: Four times a day (QID) | ORAL | Status: DC | PRN

## 2017-11-08 MED ORDER — FENTANYL CITRATE (PF) 100 MCG/2ML IJ SOLN
INTRAMUSCULAR | Status: DC | PRN
  Administered 2017-11-08: 25 ug via INTRAVENOUS
  Administered 2017-11-08: 50 ug via INTRAVENOUS
  Administered 2017-11-08: 25 ug via INTRAVENOUS

## 2017-11-08 MED ORDER — FENTANYL CITRATE (PF) 100 MCG/2ML IJ SOLN
INTRAMUSCULAR | Status: AC
  Filled 2017-11-08: qty 2

## 2017-11-08 MED ORDER — PROPOFOL 10 MG/ML IV BOLUS
INTRAVENOUS | Status: DC | PRN
  Administered 2017-11-08: 120 mg via INTRAVENOUS

## 2017-11-08 MED ORDER — LACTATED RINGERS IV SOLN
INTRAVENOUS | Status: DC
  Administered 2017-11-08 (×2): via INTRAVENOUS

## 2017-11-08 MED ORDER — SODIUM CHLORIDE 0.9 % IR SOLN
Status: DC | PRN
  Administered 2017-11-08: 3000 mL

## 2017-11-08 MED ORDER — PROPOFOL 10 MG/ML IV BOLUS
INTRAVENOUS | Status: AC
  Filled 2017-11-08: qty 20

## 2017-11-08 MED ORDER — MUPIROCIN 2 % EX OINT
TOPICAL_OINTMENT | CUTANEOUS | Status: AC
  Filled 2017-11-08: qty 22

## 2017-11-08 MED ORDER — HALOPERIDOL LACTATE 2 MG/ML PO CONC
2.0000 mg | Freq: Four times a day (QID) | ORAL | Status: DC | PRN
  Administered 2017-11-11 – 2017-11-15 (×2): 2 mg via ORAL
  Filled 2017-11-08 (×6): qty 1

## 2017-11-08 MED ORDER — SODIUM CHLORIDE 0.9 % IV SOLN
INTRAVENOUS | Status: DC
  Administered 2017-11-08: 12:00:00 via INTRAVENOUS

## 2017-11-08 MED ORDER — POVIDONE-IODINE 10 % EX SWAB
2.0000 "application " | Freq: Once | CUTANEOUS | Status: AC
  Administered 2017-11-08: 2 via TOPICAL

## 2017-11-08 MED ORDER — LIDOCAINE 2% (20 MG/ML) 5 ML SYRINGE
INTRAMUSCULAR | Status: DC | PRN
  Administered 2017-11-08: 100 mg via INTRAVENOUS

## 2017-11-08 MED ORDER — FENTANYL CITRATE (PF) 100 MCG/2ML IJ SOLN
25.0000 ug | INTRAMUSCULAR | Status: DC | PRN
  Administered 2017-11-08 (×2): 25 ug via INTRAVENOUS

## 2017-11-08 MED ORDER — ENOXAPARIN SODIUM 40 MG/0.4ML ~~LOC~~ SOLN
40.0000 mg | SUBCUTANEOUS | Status: DC
  Administered 2017-11-09 – 2017-11-13 (×5): 40 mg via SUBCUTANEOUS
  Filled 2017-11-08 (×5): qty 0.4

## 2017-11-08 SURGICAL SUPPLY — 42 items
BAG ZIPLOCK 12X15 (MISCELLANEOUS) ×3 IMPLANT
BANDAGE ESMARK 6X9 LF (GAUZE/BANDAGES/DRESSINGS) ×1 IMPLANT
BLADE SAW SGTL 11.0X1.19X90.0M (BLADE) IMPLANT
BNDG ESMARK 6X9 LF (GAUZE/BANDAGES/DRESSINGS) ×3
BNDG GAUZE ELAST 4 BULKY (GAUZE/BANDAGES/DRESSINGS) ×3 IMPLANT
COVER SURGICAL LIGHT HANDLE (MISCELLANEOUS) ×3 IMPLANT
CUFF TOURN SGL QUICK 18 (TOURNIQUET CUFF) ×3 IMPLANT
CUFF TOURN SGL QUICK 24 (TOURNIQUET CUFF) ×2
CUFF TOURN SGL QUICK 34 (TOURNIQUET CUFF) ×2
CUFF TRNQT CYL 24X4X40X1 (TOURNIQUET CUFF) ×1 IMPLANT
CUFF TRNQT CYL 34X4X40X1 (TOURNIQUET CUFF) ×1 IMPLANT
DRAIN PENROSE 18X1/2 LTX STRL (DRAIN) IMPLANT
DRSG PAD ABDOMINAL 8X10 ST (GAUZE/BANDAGES/DRESSINGS) ×3 IMPLANT
DURAPREP 26ML APPLICATOR (WOUND CARE) ×3 IMPLANT
ELECT REM PT RETURN 15FT ADLT (MISCELLANEOUS) ×3 IMPLANT
GAUZE PACKING IODOFORM 2 (PACKING) ×3 IMPLANT
GAUZE SPONGE 4X4 12PLY STRL (GAUZE/BANDAGES/DRESSINGS) ×3 IMPLANT
GLOVE BIOGEL M 7.0 STRL (GLOVE) IMPLANT
GLOVE BIOGEL PI IND STRL 7.5 (GLOVE) ×1 IMPLANT
GLOVE BIOGEL PI IND STRL 8.5 (GLOVE) ×1 IMPLANT
GLOVE BIOGEL PI INDICATOR 7.5 (GLOVE) ×2
GLOVE BIOGEL PI INDICATOR 8.5 (GLOVE) ×2
GLOVE ECLIPSE 8.0 STRL XLNG CF (GLOVE) IMPLANT
GLOVE ORTHO TXT STRL SZ7.5 (GLOVE) ×6 IMPLANT
GLOVE SURG ORTHO 8.0 STRL STRW (GLOVE) ×3 IMPLANT
GOWN STRL REUS W/TWL LRG LVL3 (GOWN DISPOSABLE) ×3 IMPLANT
GOWN STRL REUS W/TWL XL LVL3 (GOWN DISPOSABLE) ×6 IMPLANT
HANDPIECE INTERPULSE COAX TIP (DISPOSABLE) ×2
KIT BASIN OR (CUSTOM PROCEDURE TRAY) ×3 IMPLANT
MANIFOLD NEPTUNE II (INSTRUMENTS) ×3 IMPLANT
PACK ORTHO EXTREMITY (CUSTOM PROCEDURE TRAY) ×3 IMPLANT
PAD ABD 8X10 STRL (GAUZE/BANDAGES/DRESSINGS) ×3 IMPLANT
PAD CAST 4YDX4 CTTN HI CHSV (CAST SUPPLIES) ×1 IMPLANT
PADDING CAST COTTON 4X4 STRL (CAST SUPPLIES) ×2
POSITIONER SURGICAL ARM (MISCELLANEOUS) ×3 IMPLANT
SET HNDPC FAN SPRY TIP SCT (DISPOSABLE) ×1 IMPLANT
SOL PREP PROV IODINE SCRUB 4OZ (MISCELLANEOUS) ×3 IMPLANT
SPONGE LAP 18X18 RF (DISPOSABLE) ×6 IMPLANT
SYR BULB IRRIGATION 50ML (SYRINGE) ×3 IMPLANT
SYR CONTROL 10ML LL (SYRINGE) ×3 IMPLANT
TOWEL OR 17X26 10 PK STRL BLUE (TOWEL DISPOSABLE) ×6 IMPLANT
YANKAUER SUCT BULB TIP 10FT TU (MISCELLANEOUS) ×3 IMPLANT

## 2017-11-08 NOTE — Brief Op Note (Signed)
10/31/2017 - 11/08/2017  7:15 PM  PATIENT:  Samuel Lambert  62 y.o. male  PRE-OPERATIVE DIAGNOSIS: Right upper arm abscess  POST-OPERATIVE DIAGNOSIS:  Right upper arm abscess  PROCEDURE:  Procedure(s): INCISION AND DRAINAGE ABSCESS (Right)  SURGEON:  Surgeon(s) and Role:    Durene Romans* Zeanna Sunde, MD - Primary  PHYSICIAN ASSISTANT: None  ANESTHESIA:   general  EBL:  100cc  BLOOD ADMINISTERED:none  DRAINS: iodoform packing gauze   LOCAL MEDICATIONS USED:  NONE  SPECIMEN:  Source of Specimen:  right shoulder abscess  DISPOSITION OF SPECIMEN:  PATHOLOGY  COUNTS:  YES  TOURNIQUET:  * Missing tourniquet times found for documented tourniquets in log: [id number] *  DICTATION: .Other Dictation: Dictation Number [id number]  PLAN OF CARE: Admit to inpatient   PATIENT DISPOSITION:  PACU - hemodynamically stable.   Delay start of Pharmacological VTE agent (>24hrs) due to surgical blood loss or risk of bleeding: no

## 2017-11-08 NOTE — Transfer of Care (Signed)
Immediate Anesthesia Transfer of Care Note  Patient: Samuel Lambert  Procedure(s) Performed: INCISION AND DRAINAGE ABSCESS (Right Shoulder)  Patient Location: PACU  Anesthesia Type:General  Level of Consciousness: awake, alert  and oriented  Airway & Oxygen Therapy: Patient Spontanous Breathing  Post-op Assessment: Report given to RN and Post -op Vital signs reviewed and stable  Post vital signs: Reviewed and stable  Last Vitals:  Vitals Value Taken Time  BP 128/63 11/08/2017  8:21 PM  Temp    Pulse 79 11/08/2017  8:22 PM  Resp 20 11/08/2017  8:25 PM  SpO2 98 % 11/08/2017  8:22 PM  Vitals shown include unvalidated device data.  Last Pain:  Vitals:   11/08/17 1537  TempSrc:   PainSc: Asleep      Patients Stated Pain Goal: 2 (11/08/17 0750)  Complications: No apparent anesthesia complications

## 2017-11-08 NOTE — Progress Notes (Signed)
Patient ID: Samuel Lambert, male   DOB: 12/19/1955, 62 y.o.   MRN: [id number]   Resting comfortably this morning.  No acute events.  AF VSS Right upper shoulder with persistent erythema and wound with drainage    CLINICAL DATA:  Patient admitted 10/31/2017 with right upper extremity pain, swelling and weeping wounds.  EXAM: CT OF THE UPPER RIGHT EXTREMITY WITH CONTRAST  TECHNIQUE: Multidetector CT imaging of the upper right extremity was performed according to the standard protocol following intravenous contrast administration.  COMPARISON:  CT of the right upper extremity 10/31/2017.  CONTRAST:  100mL ISOVUE-300 IOPAMIDOL (ISOVUE-300) INJECTION 61%  FINDINGS: Bones/Joint/Cartilage  No acute bony or joint abnormality is identified. No bony destructive change or periosteal reaction.  Ligaments  Suboptimally assessed by CT.  Muscles and Tendons  There is a large rim enhancing fluid collection along the lateral aspect of the right upper extending into the posterior compartment of the upper arm. The collection tracks throughout the craniocaudal extent of the upper arm measuring approximately 36 cm craniocaudal. In the axial plane, the collection extends throughout the subcutaneous tissues along the lateral aspect of the upper arm across its AP diameter. The collection measures up to approximately 5.2 cm transverse in the posterior aspect of the upper arm by 7 cm AP along the proximal diaphysis of the humerus. At it's most superior point, the collection is within the deltoid muscle. More inferiorly, it extends between the heads of the triceps muscle. A focal intramuscular fluid collection measuring 1.3 cm AP by 1 cm transverse by 1.4 cm craniocaudal is seen in the superficial subcutaneous tissues of the left upper arm medially.  Soft tissues  The lungs are severely emphysematous. There is a small right pleural effusion.  IMPRESSION: Large rim enhancing  fluid collection in the posterior compartment of the right upper arm and lateral subcutaneous tissues consistent with abscess. No CT evidence of osteomyelitis is identified. As noted above, the patient's abscess extends between the heads of the triceps muscles worrisome for myositis. Abscess is also seen within the substance of the superficial deltoid posteriorly.  Electronically Signed: By: Drusilla Kannerhomas  Dalessio M.D.    Assessment: Initial presentation as right arm cellulitis with wound has further defined itself as a right arm abscess by repeat CT scan.  This is consistent with his persistent drainage and lack of significant improvement on clinical exam  Plan: Based on the identification of the defined abscess at this point we will take him to the operating room for formal I&D of the right shoulder.  We will evaluate his wound and I&D the subcutaneous tissues as well as the muscle in this area including deltoid and triceps region by digital palpation followed by non-excisional debridement with fluid. Consent ordered N.p.o. Now Postoperative antibiotics per medicine team based on cultures taken versus universal treatment.

## 2017-11-08 NOTE — Progress Notes (Signed)
No dc today. Patient having surgery today.   LCSW will continue to follow for dc needs.   Beulah GandyBernette Sweta Halseth, LSCW [facility] CSW [phone]

## 2017-11-08 NOTE — Anesthesia Procedure Notes (Signed)
Procedure Name: LMA Insertion Date/Time: 11/08/2017 7:22 PM Performed by: Suzy Kugel D, CRNA Pre-anesthesia Checklist: Patient identified, Emergency Drugs available, Suction available and Patient being monitored Patient Re-evaluated:Patient Re-evaluated prior to induction Oxygen Delivery Method: Circle system utilized Preoxygenation: Pre-oxygenation with 100% oxygen Induction Type: IV induction Ventilation: Mask ventilation without difficulty LMA: LMA inserted LMA Size: 4.0 Tube type: Oral Number of attempts: 1 Placement Confirmation: positive ETCO2 and breath sounds checked- equal and bilateral Tube secured with: Tape Dental Injury: Teeth and Oropharynx as per pre-operative assessment

## 2017-11-08 NOTE — Progress Notes (Addendum)
MD, It looks like patient has not had BM in 8 days.  Patient is refusing laxatives at this time but will need something soon.  Could we have PRN laxative order please? Thanks, [name], [name]    Laxative, Dulcolax 10mg  PO PRN, has been ordered since 10/31/2017.  [name] D.O.

## 2017-11-08 NOTE — Op Note (Addendum)
NAME: Samuel Lambert, RADEN MEDICAL RECORD WU:[id number] ACCOUNT [id number] DATE OF BIRTH:February 21, 1956 FACILITY: [facility] LOCATION: [facility] PHYSICIAN:Charlene Detter Rosalia Hammers, MD  OPERATIVE REPORT  DATE OF PROCEDURE:  11/08/2017  PREOPERATIVE DIAGNOSIS:  Right arm abscess.  POSTOPERATIVE DIAGNOSIS:  Right arm abscess.  PROCEDURE: 1.  Excisional debridement of right arm wound including skin and a 6-inch incision, nonviable subcutaneous tissue, muscle and fascia with use of scalpel. 2.  Nonexcisional debridement of right arm abscess with 3 L of normal saline solution with pulse lavage.   SURGEON:  Durene Romans, MD  ASSISTANT:  Surgical team.  ANESTHESIA:  General.  SPECIMENS:  Cultures were taken from the arm.  DRAINS:  No particular drain, but the wound was packed with iodoform gauze.  ESTIMATED BLOOD LOSS:  100 mL.  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  He presented to the emergency room with right arm erythema, swelling, and a small 1 cm wound.  Initial evaluation radiographically indicated concerns for cellulitis with  underlying myositis changes, but no frank abscess.  He was admitted to the hospital on 06/26.  He was placed on IV antibiotics.  He was followed while in the hospital and noting some improvement with the erythema, but no significant improvement overall  in his clinical picture with persistent drainage from this wound.  A CT scan was repeated, which revealed consolidation of this infection into an abscess in the arm.  Given these findings, he was scheduled for surgery.  Risks and benefits were reviewed and assessed, and the procedure was reviewed and the consent was obtained.  PROCEDURE IN DETAIL:  The patient was brought to the operative theater.  Once adequate anesthesia and preoperative antibiotics had been already administered as he is on perioperative antibiotics, he was positioned into a sloppy lateral position with the  right arm  up.  The right upper extremity was then prepped and draped in sterile fashion.  Timeout was performed identifying the patient, the planned procedure and extremity.  I elected, based on his CT findings, to make a 6-inch incision or thereabouts from the area of his draining abscess to a couple of inches proximal and 4/5 inches below.  We excised the area of the wound.  The immediate findings were pus around the area of his wound.  There was evidence of this cellulitic change underneath and into the subcutaneous layer.  Further dissection posterolateral into the triceps region revealed the large abscess identified by CT scan.  This extended all the way down towards the elbow.  A tremendous amount, up to 250 mL, of purulent material was removed and evacuated from this area.  Following this, I did do some excisional debridement with a scalpel of nonviable subcutaneous tissue and muscle fascia that was present.  Once I completed the debridement and  elevation of the tissues around this area, I irrigated the entire wound, including the deep portion with 3 L of normal saline solution with pulse lavage.  At this point, I felt there was a very adequate debridement and irrigation of this wound.  I  elected to pack it with a plan to remove it in a couple of days.  The wound was packed.  I then used 2-0 nylon to reapproximate the skin edges other than the area where the packing material was coming out.  The arm was then cleaned, dried and dressed sterilely with a bulky dressing with tape.  He was then awoken from anesthesia and brought to the recovery room in  stable condition.  POSTOPERATIVE PLAN:  The patient will be hospitalized.  I will plan to remove his packing in 2 days with plans to begin wet-to-dry dressing changes as needed.  He will remain on IV antibiotics.  Cultures pending.  LN/NUANCE  D:11/08/2017 T:11/08/2017 JOB:[id number]

## 2017-11-08 NOTE — Anesthesia Postprocedure Evaluation (Signed)
Anesthesia Post Note  Patient: Samuel BeneRoger Saling  Procedure(s) Performed: INCISION AND DRAINAGE ABSCESS (Right Shoulder)     Patient location during evaluation: PACU Anesthesia Type: General Level of consciousness: awake Pain management: pain level controlled Vital Signs Assessment: post-procedure vital signs reviewed and stable Respiratory status: spontaneous breathing Cardiovascular status: stable Anesthetic complications: no    Last Vitals:  Vitals:   11/08/17 2100 11/08/17 2116  BP: (!) 129/53 (!) 127/43  Pulse: 65 63  Resp: 15 16  Temp: (!) 36.4 C (!) 36.4 C  SpO2: 100% 98%    Last Pain:  Vitals:   11/08/17 1537  TempSrc:   PainSc: Asleep                 [name]

## 2017-11-08 NOTE — Anesthesia Preprocedure Evaluation (Signed)
Anesthesia Evaluation  Patient identified by MRN, date of birth, ID band Patient awake    Reviewed: Allergy & Precautions, NPO status , Patient's Chart, lab work & pertinent test results  Airway Mallampati: II  TM Distance: >3 FB     Dental   Pulmonary    breath sounds clear to auscultation       Cardiovascular negative cardio ROS   Rhythm:Regular Rate:Normal     Neuro/Psych History noted. CG    GI/Hepatic negative GI ROS, Neg liver ROS,   Endo/Other  negative endocrine ROS  Renal/GU      Musculoskeletal   Abdominal   Peds  Hematology   Anesthesia Other Findings   Reproductive/Obstetrics                             Anesthesia Physical Anesthesia Plan  ASA: IV  Anesthesia Plan: General   Post-op Pain Management:    Induction: Intravenous  PONV Risk Score and Plan: Treatment may vary due to age or medical condition  Airway Management Planned: LMA and Oral ETT  Additional Equipment:   Intra-op Plan:   Post-operative Plan: Extubation in OR  Informed Consent: I have reviewed the patients History and Physical, chart, labs and discussed the procedure including the risks, benefits and alternatives for the proposed anesthesia with the patient or authorized representative who has indicated his/her understanding and acceptance.   Dental advisory given  Plan Discussed with: CRNA, Anesthesiologist and Surgeon  Anesthesia Plan Comments:         Anesthesia Quick Evaluation

## 2017-11-08 NOTE — Progress Notes (Signed)
Nutrition Follow-up  DOCUMENTATION CODES:   Underweight  INTERVENTION:   Once diet is advanced following procedure: Continue Ensure Enlive po TID, each supplement provides 350 kcal and 20 grams of protein Continue Magic cup TID with meals, each supplement provides 290 kcal and 9 grams of protein Continue Multivitamin with minerals daily  NUTRITION DIAGNOSIS:   Inadequate oral intake related to poor appetite as evidenced by other (comment)(report from sitter at bedside).  Ongoing.  GOAL:   Patient will meet greater than or equal to 90% of their needs  Not meeting currently.  MONITOR:   PO intake, Supplement acceptance, Weight trends, Labs, Skin   ASSESSMENT:   62 y.o. male with medical history significant of Huntington's disease, who follows up with neurology clinic.  His movement disorder has been severe affecting his quality of life and overall health.  He is a resident from the nursing home.  Patient was brought to the hospital due to decreased p.o. intake, in the emergency department he was noted to have a right upper extremity rash.    Patient currently NPO for I&D of right arm abscess. Pt has been consuming minimal amounts of fluids such as ice cream and pudding. Pt refusing meals. Ensure has been provided as well. Will continue supplements following procedure today and diet advancement.   Medications: Multivitamin with minerals daily, D5 -.45% NaCl infusion at 75 ml/hr -provides 306 kcal Labs reviewed: Low K  Diet Order:   Diet Order           Diet NPO time specified Except for: Sips with Meds  Diet effective midnight        DIET SOFT Room service appropriate? Yes; Fluid consistency: Thin  Diet effective now          EDUCATION NEEDS:   No education needs have been identified at this time  Skin:  Skin Assessment: Skin Integrity Issues: Skin Integrity Issues:: Other (Comment) Other: R arm non-pressure injury  Last BM:  PTA/unknown  Height:   Ht  Readings from Last 1 Encounters:  11/01/17 6\' 3"  (1.905 m)    Weight:   Wt Readings from Last 1 Encounters:  11/01/17 145 lb (65.8 kg)    Ideal Body Weight:  89.09 kg  BMI:  Body mass index is 18.12 kg/m.  Estimated Nutritional Needs:   Kcal:  1775-1975 (27-30 kcal/kg)  Protein:  75-85 grams  Fluid:  >/= 1.8 L/day  Tilda FrancoLindsey Takeesha Isley, MS, RD, LDN [facility] Clinical Dietitian Pager: [phone] After Hours Pager: [phone]

## 2017-11-08 NOTE — Progress Notes (Signed)
PROGRESS NOTE    Samuel Lambert  UEA:[id number] DOB: 1955/08/06 DOA: 10/31/2017 PCP: [name], PA   Brief Narrative:  HPI on 11/01/2017 by Dr. Delrae Sawyers [name] is a 62 y.o. male with medical history significant of Huntington's disease, who follows up with neurology clinic.  His movement disorder has been severe affecting his quality of life and overall health.  He is a resident from the nursing home.  Patient was brought to the hospital due to decreased p.o. intake, in the emergency department he was noted to have a right upper extremity rash.    On direct questioning patient reports severe pain of the right upper extremity, associated with pruritus, no improving or worsening factors, denies any other associated symptoms, symptoms present for last few days.  Unable to get a clear history due to patient's current cognitive dysfunction.   Interim history  Admitted for sepsis secondary to right upper extremity cellulitis and myositis, placed on IV vancomycin and Zosyn and transitioned to ceftriaxone. Blood cultures from 6/24 showed GNR, patient started on flagyl. Vancomycin added. Plan for I&D today by [name].   Assessment & Plan   Sepsis secondary to right upper extreme the cellulitis/myositis -Patient presented with tachycardia, tachypnea, elevated lactic acid -CT right upper extremity: Moderate circumferential soft tissue swelling about the upper arm, can be seen with cellulitis, venous insufficiency or third spacing.  No abscess.  Ill-defined hypodensity throughout the triceps musculature, may reflect underlying myositis.  No osseous abnormality. -Blood cultures 1/2 from 10/31/2017 +GNR--> fusobacterium nucleatum, beta lactamase negative -Placed on IV Zosyn and vancomycin and transitioned to ceftriaxone and flagyl -Orthopedic surgery consulted and appreciated-recommended continuing IV antibiotics for now, no surgery. Reassessed today 6/28 by [name], continue IV antibiotics  and transition to PO after the weekend -[name] negative for DVT -Slowly improving -repeat Blood cultures on 6/29 show no growth to date -Vancomycin added given the amount of drainage and appearance -discussed with [name], felt that a repeat CT scan: Large rim-enhancing fluid collection in the posterior compartment of the right upper arm and lateral subcutaneous tissues consistent with abscess.  Abscess is also seen in the substance of the superficial deltoid posteriorly. No osteomyelitis. -plan for surgery, I&D today by [name]  Acute kidney injury complicated by hypernatremia and hyperchloremia -Resolved -Suspect secondary to sepsis and dehydration, prerenal causes -Creatinine 1.42 on admission, currently 0.53 -Placed on IV fluids -Continue to monitor BMP  Huntington's chorea -Patient does have low functional capacity and is an overall poor health -Continue supportive medical care -Continue citalopram and tetrabenazine  Dyspepsia -Continue famotidine  Normocytic anemia -hemoglobin 11.2, drop from 14 (suspect delusional component as patient is receiving IV fluids) -No previous hemoglobin for comparison -continue to monitor CBC  Leukocytosis  -possibly secondary to the above,  WBC mildly improved 16.9 -vancomycin has been added  Protein calorie malnutrition -Nutrition consulted, continue supplements  Hypokalemia -potassium 3.3, will replace and continue to monitor  DVT Prophylaxis  Lovenox  Code Status: Full  Family Communication: None at bedside  Disposition Plan: Admitted. Pending improvement. Suspect discharge to ALF within the next 3-4 days as patient's infection has been slow to improve.   Consultants Orthopedic surgery  Procedures  Right Upper extremity doppler  Antibiotics   Anti-infectives (From admission, onward)   Start     Dose/Rate Route Frequency Ordered Stop   11/08/17 0000  vancomycin (VANCOCIN) IVPB 750 mg/150 ml premix     750 mg 150 mL/hr  over 60 Minutes  Intravenous Every 12 hours 11/07/17 0943     11/07/17 1030  vancomycin (VANCOCIN) 1,250 mg in sodium chloride 0.9 % 250 mL IVPB     1,250 mg 166.7 mL/hr over 90 Minutes Intravenous  Once 11/07/17 0943 11/07/17 1125   11/05/17 1200  metroNIDAZOLE (FLAGYL) IVPB 500 mg     500 mg 100 mL/hr over 60 Minutes Intravenous Every 8 hours 11/05/17 1041     11/02/17 1200  cefTRIAXone (ROCEPHIN) 1 g in sodium chloride 0.9 % 100 mL IVPB     1 g 200 mL/hr over 30 Minutes Intravenous Every 24 hours 11/02/17 0911     11/01/17 2200  vancomycin (VANCOCIN) 1,250 mg in sodium chloride 0.9 % 250 mL IVPB  Status:  Discontinued     1,250 mg 166.7 mL/hr over 90 Minutes Intravenous Every 48 hours 10/31/17 2142 11/02/17 0911   11/01/17 0400  piperacillin-tazobactam (ZOSYN) IVPB 3.375 g  Status:  Discontinued     3.375 g 12.5 mL/hr over 240 Minutes Intravenous Every 8 hours 10/31/17 2142 11/02/17 0911   10/31/17 2130  piperacillin-tazobactam (ZOSYN) IVPB 3.375 g  Status:  Discontinued     3.375 g 100 mL/hr over 30 Minutes Intravenous  Once 10/31/17 2115 10/31/17 2115   10/31/17 2130  vancomycin (VANCOCIN) IVPB 1000 mg/200 mL premix  Status:  Discontinued     1,000 mg 200 mL/hr over 60 Minutes Intravenous  Once 10/31/17 2115 10/31/17 2115   10/31/17 1815  piperacillin-tazobactam (ZOSYN) IVPB 3.375 g     3.375 g 100 mL/hr over 30 Minutes Intravenous NOW 10/31/17 1812 10/31/17 2350   10/31/17 1100  piperacillin-tazobactam (ZOSYN) IVPB 3.375 g     3.375 g 100 mL/hr over 30 Minutes Intravenous  Once 10/31/17 1049 10/31/17 1152   10/31/17 1100  vancomycin (VANCOCIN) IVPB 1000 mg/200 mL premix     1,000 mg 200 mL/hr over 60 Minutes Intravenous  Once 10/31/17 1049 10/31/17 1220      Subjective:   Samuel Lambert seen and examined today. Continues to complain of right arm pain. Denies chest pain and shortness of breath.   Objective:   Vitals:   11/07/17 1402 11/07/17 1615 11/07/17 2002 11/08/17  0442  BP:  132/60 122/72 120/70  Pulse: 86 79 85 75  Resp: 20  20 16   Temp: 98.2 F (36.8 C)  (!) 97.5 F (36.4 C) 97.8 F (36.6 C)  TempSrc: Axillary  Oral Oral  SpO2: 95%  96% 99%  Weight:      Height:        Intake/Output Summary (Last 24 hours) at 11/08/2017 1320 Last data filed at 11/08/2017 1026 Gross per 24 hour  Intake 2856.25 ml  Output 1225 ml  Net 1631.25 ml   Filed Weights   11/01/17 1425  Weight: 65.8 kg (145 lb)   Exam  General: Well developed, chronically ill appearing, NAD  HEENT: NCAT, mucous membranes moist.   Neck: Supple  Cardiovascular: S1 S2 auscultated, no rubs, murmurs or gallops. Regular rate and rhythm.  Respiratory: Clear to auscultation bilaterally with equal chest rise  Abdomen: Soft, nontender, nondistended, + bowel sounds  Extremities: warm dry without cyanosis clubbing or edema  Neuro: Awake and alert, moves all extremities  Skin: RUE with erythema and edema   Psych: pleasant  Data Reviewed: I have personally reviewed following labs and imaging studies  CBC: Recent Labs  Lab 11/02/17 0358 11/03/17 0355 11/06/17 0520 11/07/17 0542 11/08/17 0713  WBC 7.2 9.1 17.7* 16.9* 16.0*  NEUTROABS  6.1  --   --   --   --   HGB 10.7* 10.4* 11.6* 10.8* 11.2*  HCT 33.1* 31.9* 35.1* 33.3* 34.6*  MCV 91.9 92.5 90.5 93.0 92.5  PLT 219 172 407* 519* 598*   Basic Metabolic Panel: Recent Labs  Lab 11/03/17 0355 11/05/17 0335 11/06/17 0520 11/07/17 0542 11/08/17 0713  NA 140 140 140 138 139  K 3.6 4.1 3.7 3.7 3.3*  CL 106 106 105 102 102  CO2 27 27 29 27 30   GLUCOSE 161* 131* 125* 119* 124*  BUN 16 12 9 9 7   CREATININE 0.61 0.51* 0.53* 0.52* 0.53*  CALCIUM 8.3* 7.9* 7.7* 7.6* 7.5*   GFR: Estimated Creatinine Clearance: 91.4 mL/min (A) (by C-G formula based on SCr of 0.53 mg/dL (L)). Liver Function Tests: No results for input(s): AST, ALT, ALKPHOS, BILITOT, PROT, ALBUMIN in the last 168 hours. No results for input(s): LIPASE,  AMYLASE in the last 168 hours. No results for input(s): AMMONIA in the last 168 hours. Coagulation Profile: No results for input(s): INR, PROTIME in the last 168 hours. Cardiac Enzymes: No results for input(s): CKTOTAL, CKMB, CKMBINDEX, TROPONINI in the last 168 hours. BNP (last 3 results) No results for input(s): PROBNP in the last 8760 hours. HbA1C: No results for input(s): HGBA1C in the last 72 hours. CBG: No results for input(s): GLUCAP in the last 168 hours. Lipid Profile: No results for input(s): CHOL, HDL, LDLCALC, TRIG, CHOLHDL, LDLDIRECT in the last 72 hours. Thyroid Function Tests: No results for input(s): TSH, T4TOTAL, FREET4, T3FREE, THYROIDAB in the last 72 hours. Anemia Panel: No results for input(s): VITAMINB12, FOLATE, FERRITIN, TIBC, IRON, RETICCTPCT in the last 72 hours. Urine analysis:    Component Value Date/Time   COLORURINE AMBER (A) 10/31/2017 1529   APPEARANCEUR CLOUDY (A) 10/31/2017 1529   LABSPEC 1.027 10/31/2017 1529   PHURINE 5.0 10/31/2017 1529   GLUCOSEU 50 (A) 10/31/2017 1529   HGBUR LARGE (A) 10/31/2017 1529   BILIRUBINUR NEGATIVE 10/31/2017 1529   KETONESUR NEGATIVE 10/31/2017 1529   PROTEINUR 100 (A) 10/31/2017 1529   NITRITE NEGATIVE 10/31/2017 1529   LEUKOCYTESUR NEGATIVE 10/31/2017 1529   Sepsis Labs: @LABRCNTIP (procalcitonin:4,lacticidven:4)  ) Recent Results (from the past 240 hour(s))  Blood Culture (routine x 2)     Status: Abnormal   Collection Time: 10/31/17 10:48 AM  Result Value Ref Range Status   Specimen Description   Final    BLOOD RIGHT ANTECUBITAL Performed at [facility], [address] [place], [place] [address]    Special Requests   Final    BOTTLES DRAWN AEROBIC AND ANAEROBIC Blood Culture adequate volume Performed at [facility], [address] [place], [place] [address]    Culture  Setup Time   Final    GRAM NEGATIVE RODS ANAEROBIC BOTTLE ONLY CRITICAL RESULT CALLED  TO, READ BACK BY AND VERIFIED WITH: E JACKSON,PHARMD AT 1011 11/05/17 BY L BENFIELD     Culture (A)  Final    FUSOBACTERIUM NUCLEATUM BETA LACTAMASE NEGATIVE Performed at [facility], [address] [place], [place] [address]    Report Status 11/07/2017 FINAL  Final  Blood Culture (routine x 2)     Status: None   Collection Time: 10/31/17 10:53 AM  Result Value Ref Range Status   Specimen Description   Final    BLOOD LEFT FOREARM Performed at [facility], [address] [place], [place] [address]    Special Requests  Final    BOTTLES DRAWN AEROBIC AND ANAEROBIC Blood Culture adequate volume Performed at [facility], [address], [place], [place] [address]    Culture   Final    NO GROWTH 5 DAYS Performed at [facility], [address], [place], [place] [address]    Report Status 11/05/2017 FINAL  Final  Urine culture     Status: None   Collection Time: 10/31/17  3:29 PM  Result Value Ref Range Status   Specimen Description   Final    URINE, CATHETERIZED Performed at [facility], [address] [place], [place] [address]    Special Requests   Final    Normal Performed at [facility], [address] [place], [place] [address]    Culture   Final    NO GROWTH Performed at [facility], [address], [place], [place] [address]    Report Status 11/01/2017 FINAL  Final  MRSA PCR Screening     Status: Abnormal   Collection Time: 10/31/17  9:30 PM  Result Value Ref Range Status   MRSA by PCR POSITIVE (A) NEGATIVE Final    Comment:        The GeneXpert MRSA Assay (FDA approved for NASAL specimens only), is one component of a comprehensive MRSA colonization surveillance program. It is not intended to diagnose MRSA infection nor to guide or monitor treatment for MRSA infections. RESULT CALLED TO, READ BACK BY AND VERIFIED WITH: H.RICHARD RN AT [phone] ON 11/01/17 BY  S.VANHOORNE Performed at [facility], [address], [place], [place] [address]   Culture, blood (routine x 2)     Status: None (Preliminary result)   Collection Time: 11/05/17 11:21 AM  Result Value Ref Range Status   Specimen Description BLOOD LEFT ARM  Final   Special Requests   Final    BOTTLES DRAWN AEROBIC ONLY Blood Culture adequate volume Performed at [facility], [address] [place], [place] [address]    Culture   Final    NO GROWTH 3 DAYS Performed at [facility], [address] [place], [place] [address]    Report Status PENDING  Incomplete  Culture, blood (routine x 2)     Status: None (Preliminary result)   Collection Time: 11/05/17 11:22 AM  Result Value Ref Range Status   Specimen Description BLOOD LEFT HAND  Final   Special Requests   Final    BOTTLES DRAWN AEROBIC ONLY Blood Culture results may not be optimal due to an inadequate volume of blood received in culture bottles Performed at [facility], [address] [place], [place] [address]    Culture   Final    NO GROWTH 3 DAYS Performed at [facility], [address] [place], [place] [address]    Report Status PENDING  Incomplete  Aerobic/Anaerobic Culture (surgical/deep wound)     Status: None (Preliminary result)   Collection Time: 11/07/17  1:29 PM  Result Value Ref Range Status   Specimen Description   Final    ABSCESS RIGHT ARM Performed at [facility], [address] [place], [place] [address]    Special Requests   Final    NONE Performed at [facility], [address], [place], [place] [address]    Gram Stain   Final    ABUNDANT WBC PRESENT, PREDOMINANTLY PMN MODERATE GRAM POSITIVE COCCI  Culture   Final    MODERATE STAPHYLOCOCCUS AUREUS SUSCEPTIBILITIES TO FOLLOW Performed at [facility], [address] [place], [place] [address]    Report Status PENDING   Incomplete      Radiology Studies: Ct Extrem Up Entire Arm R W/cm  Addendum Date: 11/07/2017   ADDENDUM REPORT: 11/07/2017 19:51 ADDENDUM: This addendum is given for the purpose of noting the patient has severe emphysema and a small right pleural effusion. Electronically Signed   By: Drusilla Kanner M.D.   On: 11/07/2017 19:51   Result Date: 11/07/2017 CLINICAL DATA:  Patient admitted 10/31/2017 with right upper extremity pain, swelling and weeping wounds. EXAM: CT OF THE UPPER RIGHT EXTREMITY WITH CONTRAST TECHNIQUE: Multidetector CT imaging of the upper right extremity was performed according to the standard protocol following intravenous contrast administration. COMPARISON:  CT of the right upper extremity 10/31/2017. CONTRAST:  ISOVUE-300 IOPAMIDOL (ISOVUE-300) INJECTION 61% FINDINGS: Bones/Joint/Cartilage No acute bony or joint abnormality is identified. No bony destructive change or periosteal reaction. Ligaments Suboptimally assessed by CT. Muscles and Tendons There is a large rim enhancing fluid collection along the lateral aspect of the right upper extending into the posterior compartment of the upper arm. The collection tracks throughout the craniocaudal extent of the upper arm measuring approximately 36 cm craniocaudal. In the axial plane, the collection extends throughout the subcutaneous tissues along the lateral aspect of the upper arm across its AP diameter. The collection measures up to approximately 5.2 cm transverse in the posterior aspect of the upper arm by 7 cm AP along the proximal diaphysis of the humerus. At it's most superior point, the collection is within the deltoid muscle. More inferiorly, it extends between the heads of the triceps muscle. A focal intramuscular fluid collection measuring 1.3 cm AP by 1 cm transverse by 1.4 cm craniocaudal is seen in the superficial subcutaneous tissues of the left upper arm medially. Soft tissues The lungs are severely emphysematous. There  is a small right pleural effusion. IMPRESSION: Large rim enhancing fluid collection in the posterior compartment of the right upper arm and lateral subcutaneous tissues consistent with abscess. No CT evidence of osteomyelitis is identified. As noted above, the patient's abscess extends between the heads of the triceps muscles worrisome for myositis. Abscess is also seen within the substance of the superficial deltoid posteriorly. Electronically Signed: By: Drusilla Kanner M.D. On: 11/07/2017 19:31     Scheduled Meds: . bacitracin   Topical BID  . chlorhexidine  60 mL Topical Once  . citalopram  10 mg Oral Daily  . enoxaparin (LOVENOX) injection  40 mg Subcutaneous Q24H  . feeding supplement (ENSURE ENLIVE)  237 mL Oral TID BM  . multivitamin with minerals  1 tablet Oral Daily  . pantoprazole  40 mg Oral Daily  . tetrabenazine  25 mg Oral BID   Continuous Infusions: . sodium chloride 50 mL/hr at 11/08/17 1158  . cefTRIAXone (ROCEPHIN)  IV Stopped (11/08/17 1150)  . dextrose 5 % and 0.45% NaCl 75 mL/hr at 11/08/17 0630  . metronidazole Stopped (11/08/17 0806)  . vancomycin 750 mg (11/08/17 1234)     LOS: 8 days   Time Spent in minutes   45 minutes  Samuel Lambert D.O. on 11/08/2017 at 1:20 PM  Between 7am to 7pm - Pager - [phone]  After 7pm go to [email or web address] - password [email or web address]  And look for the night coverage person covering for me after hours  [facility] Office  [phone]

## 2017-11-09 ENCOUNTER — Encounter (HOSPITAL_COMMUNITY): Payer: Self-pay | Admitting: Orthopedic Surgery

## 2017-11-09 LAB — BASIC METABOLIC PANEL
Anion gap: 7 (ref 5–15)
BUN: 10 mg/dL (ref 6–20)
CALCIUM: 7.5 mg/dL — AB (ref 8.9–10.3)
CO2: 29 mmol/L (ref 22–32)
Chloride: 104 mmol/L (ref 98–111)
Creatinine, Ser: 0.51 mg/dL — ABNORMAL LOW (ref 0.61–1.24)
GFR calc Af Amer: >60 mL/min (ref >60)
Glucose, Bld: 109 mg/dL — ABNORMAL HIGH (ref 70–99)
POTASSIUM: 3.5 mmol/L (ref 3.5–5.1)
SODIUM: 140 mmol/L (ref 135–145)

## 2017-11-09 LAB — CBC
HCT: 29.7 % — ABNORMAL LOW (ref 39.0–52.0)
Hemoglobin: 9.8 g/dL — ABNORMAL LOW (ref 13.0–17.0)
MCH: 30.3 pg (ref 26.0–34.0)
MCHC: 33 g/dL (ref 30.0–36.0)
MCV: 92 fL (ref 78.0–100.0)
PLATELETS: 674 10*3/uL — AB (ref 150–400)
RBC: 3.23 MIL/uL — AB (ref 4.22–5.81)
RDW: 14.3 % (ref 11.5–15.5)
WBC: 12.2 10*3/uL — ABNORMAL HIGH (ref 4.0–10.5)

## 2017-11-09 NOTE — Discharge Planning (Signed)
Ms. Samuel Lambert from [facility] where Mr. Savitt had been living stopped by. She stated that [facility] would like to be considered when Mr. Lyman BishopLawrence is discharged. She stated that they have Home Health services available.

## 2017-11-09 NOTE — Progress Notes (Signed)
PROGRESS NOTE    Samuel Lambert  ZOX:[id number] DOB: 07/01/1955 DOA: 10/31/2017 PCP: Mortimer Lambert, David, Lambert     Brief Narrative:  Samuel Lambert a 62 y.o.malewith medical history significant ofHuntington's disease, who follows up with neurology clinic.His movement disorder has been severe affecting his quality of life and overall health.He is a resident from the nursing home.Patient was brought to the hospital due to decreased p.o. intake, in the emergency department he was noted to have a right upper extremity rash. Patient reports severe pain of the right upper extremity, associated with pruritus, no improving or worsening factors, denies any other associated symptoms,symptoms present for last few days. Unable to get a clear history due to patient's current cognitive dysfunction.He underwent CT RUE which initially did not show abscess. Repeat CT revealed abscess. He underwent I&D with Dr. Charlann Boxerlin on 7/4.   New events last 24 hours / Subjective: No acute events overnight. Patient states "everything hurts" but unable to specify.   Assessment & Plan:   Active Problems:   Sepsis (HCC)  Sepsis secondary to right upper extreme the cellulitis/myositis/abscess  -Patient presented with tachycardia, tachypnea, elevated lactic acid -CT right upper extremity 6/24: Moderate circumferential soft tissue swelling about the upper arm, can be seen with cellulitis, venous insufficiency or third spacing.  No abscess.  Ill-defined hypodensity throughout the triceps musculature, may reflect underlying myositis.  No osseous abnormality. 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 I spoke with Dr. Ninetta LightsHatcher, he recommends continuing flagyl for couple more weeks.  -Repeat blood cultures on 6/29 show no growth to date -Repeat CT scan 7/1: Large rim-enhancing fluid collection in the posterior compartment of the right upper arm and lateral subcutaneous tissues  consistent with abscess.  Abscess is also seen in the substance of the superficial deltoid posteriorly. No osteomyelitis. -S/p I&D with Dr. 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 Charlann Boxerlin  ID curb-side Dr. Ninetta LightsHatcher   Procedures:   7/2 PROCEDURE: 1.  Excisional debridement of right arm wound including skin and a 6-inch incision, nonviable subcutaneous tissue, muscle and fascia. 2.  Nonexcisional debridement of right arm abscess with 3 L of normal saline solution with pulse lavage.    Antimicrobials:  Anti-infectives (From admission, onward)   Start     Dose/Rate Route Frequency Ordered Stop   11/08/17 1944  polymyxin B 500,000 Units, bacitracin 50,000 Units in sodium chloride 0.9 % 500 mL irrigation  Status:  Discontinued       As needed 11/08/17 1944 11/08/17 2015   11/08/17 0000  vancomycin (VANCOCIN) IVPB 750 mg/150 ml premix     750 mg 150 mL/hr over 60 Minutes Intravenous Every 12 hours 11/07/17 0943     11/07/17 1030  vancomycin (VANCOCIN) 1,250 mg in sodium chloride 0.9 % 250 mL IVPB     1,250 mg 166.7 mL/hr over 90 Minutes Intravenous  Once 11/07/17 0943 11/07/17 1125  11/05/17 1200  metroNIDAZOLE (FLAGYL) IVPB 500 mg     500 mg 100 mL/hr over 60  Minutes Intravenous Every 8 hours 11/05/17 1041     11/02/17 1200  cefTRIAXone (ROCEPHIN) 1 g in sodium chloride 0.9 % 100 mL IVPB  Status:  Discontinued     1 g 200 mL/hr over 30 Minutes Intravenous Every 24 hours 11/02/17 0911 11/09/17 1110   11/01/17 2200  vancomycin (VANCOCIN) 1,250 mg in sodium chloride 0.9 % 250 mL IVPB  Status:  Discontinued     1,250 mg 166.7 mL/hr over 90 Minutes Intravenous Every 48 hours 10/31/17 2142 11/02/17 0911   11/01/17 0400  piperacillin-tazobactam (ZOSYN) IVPB 3.375 g  Status:  Discontinued     3.375 g 12.5 mL/hr over 240 Minutes Intravenous Every 8 hours 10/31/17 2142 11/02/17 0911   10/31/17 2130  piperacillin-tazobactam (ZOSYN) IVPB 3.375 g  Status:  Discontinued     3.375 g 100 mL/hr over 30 Minutes Intravenous  Once 10/31/17 2115 10/31/17 2115   10/31/17 2130  vancomycin (VANCOCIN) IVPB 1000 mg/200 mL premix  Status:  Discontinued     1,000 mg 200 mL/hr over 60 Minutes Intravenous  Once 10/31/17 2115 10/31/17 2115   10/31/17 1815  piperacillin-tazobactam (ZOSYN) IVPB 3.375 g     3.375 g 100 mL/hr over 30 Minutes Intravenous NOW 10/31/17 1812 10/31/17 2350   10/31/17 1100  piperacillin-tazobactam (ZOSYN) IVPB 3.375 g     3.375 g 100 mL/hr over 30 Minutes Intravenous  Once 10/31/17 1049 10/31/17 1152   10/31/17 1100  vancomycin (VANCOCIN) IVPB 1000 mg/200 mL premix     1,000 mg 200 mL/hr over 60 Minutes Intravenous  Once 10/31/17 1049 10/31/17 1220       Objective: Vitals:   11/09/17 0105 11/09/17 0213 11/09/17 0314 11/09/17 0537  BP: (!) 134/56 (!) 133/46 (!) 105/45 (!) 109/56  Pulse:      Resp:    20  Temp:      TempSrc:      SpO2:      Weight:      Height:        Intake/Output Summary (Last 24 hours) at 11/09/2017 1114 Last data filed at 11/09/2017 1007 Gross per 24 hour  Intake 3898.33 ml  Output 530 ml  Net 3368.33 ml   Filed Weights   11/01/17 1425 11/08/17 1755 11/08/17 2116  Weight: 65.8 kg (145 lb) 65.8 kg (145 lb) 72.6 kg  (160 lb)    Examination:  General exam: Appears calm, chorea-form movements noted  Respiratory system: Clear to auscultation. Respiratory effort normal. Cardiovascular system: S1 & S2 heard, RRR. No JVD, murmurs, rubs, gallops or clicks. No pedal edema. Gastrointestinal system: Abdomen is nondistended, soft and nontender. No organomegaly or masses felt. Normal bowel sounds heard. Central nervous system: Alert, cognition remains impaired, chorea-form movements noted  Extremities: Symmetric  Skin: RUE with dry dressing in place, erythema extending distally   Data Reviewed: I have personally reviewed following labs and imaging studies  CBC: Recent Labs  Lab 11/03/17 0355 11/06/17 0520 11/07/17 0542 11/08/17 0713 11/09/17 0822  WBC 9.1 17.7* 16.9* 16.0* 12.2*  HGB 10.4* 11.6* 10.8* 11.2* 9.8*  HCT 31.9* 35.1* 33.3* 34.6* 29.7*  MCV 92.5 90.5 93.0 92.5 92.0  PLT 172 407* 519* 598* 674*   Basic Metabolic Panel: Recent Labs  Lab 11/05/17 0335 11/06/17 0520 11/07/17 0542 11/08/17 0713 11/09/17 0822  NA 140 140 138 139 140  K 4.1 3.7 3.7 3.3* 3.5  CL  106 105 102 102 104  CO2 27 29 27 30 29   GLUCOSE 131* 125* 119* 124* 109*  BUN 12 9 9 7 10   CREATININE 0.51* 0.53* 0.52* 0.53* 0.51*  CALCIUM 7.9* 7.7* 7.6* 7.5* 7.5*   GFR: Estimated Creatinine Clearance: 100.8 mL/min (A) (by C-G formula based on SCr of 0.51 mg/dL (L)). Liver Function Tests: No results for input(s): AST, ALT, ALKPHOS, BILITOT, PROT, ALBUMIN in the last 168 hours. No results for input(s): LIPASE, AMYLASE in the last 168 hours. No results for input(s): AMMONIA in the last 168 hours. Coagulation Profile: No results for input(s): INR, PROTIME in the last 168 hours. Cardiac Enzymes: No results for input(s): CKTOTAL, CKMB, CKMBINDEX, TROPONINI in the last 168 hours. BNP (last 3 results) No results for input(s): PROBNP in the last 8760 hours. HbA1C: No results for input(s): HGBA1C in the last 72  hours. CBG: No results for input(s): GLUCAP in the last 168 hours. Lipid Profile: No results for input(s): CHOL, HDL, LDLCALC, TRIG, CHOLHDL, LDLDIRECT in the last 72 hours. Thyroid Function Tests: No results for input(s): TSH, T4TOTAL, FREET4, T3FREE, THYROIDAB in the last 72 hours. Anemia Panel: No results for input(s): VITAMINB12, FOLATE, FERRITIN, TIBC, IRON, RETICCTPCT in the last 72 hours. Sepsis Labs: No results for input(s): PROCALCITON, LATICACIDVEN in the last 168 hours.  Recent Results (from the past 240 hour(s))  Blood Culture (routine x 2)     Status: Abnormal   Collection Time: 10/31/17 10:48 AM  Result Value Ref Range Status   Specimen Description   Final    BLOOD RIGHT ANTECUBITAL Performed at [facility], [address], [place], [place] [address]    Special Requests   Final    BOTTLES DRAWN AEROBIC AND ANAEROBIC Blood Culture adequate volume Performed at [facility], [address], [place], [place] [address]    Culture  Setup Time   Final    GRAM NEGATIVE RODS ANAEROBIC BOTTLE ONLY CRITICAL RESULT CALLED TO, READ BACK BY AND VERIFIED WITH: E JACKSON,PHARMD AT 1011 11/05/17 BY L BENFIELD     Culture (A)  Final    FUSOBACTERIUM NUCLEATUM BETA LACTAMASE NEGATIVE Performed at [facility], [address] [place], [place] [address]    Report Status 11/07/2017 FINAL  Final  Blood Culture (routine x 2)     Status: None   Collection Time: 10/31/17 10:53 AM  Result Value Ref Range Status   Specimen Description   Final    BLOOD LEFT FOREARM Performed at [facility], [address] [place], [place] [address]    Special Requests   Final    BOTTLES DRAWN AEROBIC AND ANAEROBIC Blood Culture adequate volume Performed at [facility], [address] [place], [place] [address]    Culture   Final    NO GROWTH 5 DAYS Performed at [facility], [address] [place],  [place] [address]    Report Status 11/05/2017 FINAL  Final  Urine culture     Status: None   Collection Time: 10/31/17  3:29 PM  Result Value Ref Range Status   Specimen Description   Final    URINE, CATHETERIZED Performed at [facility], [address], [place], [place] [address]    Special Requests   Final    Normal Performed at [facility], [address], [place], [place] [address]    Culture   Final    NO GROWTH  Performed at [facility], [address], [place], [place] [address]    Report Status 11/01/2017 FINAL  Final  MRSA PCR Screening     Status: Abnormal   Collection Time: 10/31/17  9:30 PM  Result Value Ref Range Status   MRSA by PCR POSITIVE (A) NEGATIVE Final    Comment:        The GeneXpert MRSA Assay (FDA approved for NASAL specimens only), is one component of a comprehensive MRSA colonization surveillance program. It is not intended to diagnose MRSA infection nor to guide or monitor treatment for MRSA infections. RESULT CALLED TO, READ BACK BY AND VERIFIED WITH: H.RICHARD RN AT [phone] ON 11/01/17 BY S.VANHOORNE Performed at [facility], [address], [place], [place] [address]   Culture, blood (routine x 2)     Status: None (Preliminary result)   Collection Time: 11/05/17 11:21 AM  Result Value Ref Range Status   Specimen Description BLOOD LEFT ARM  Final   Special Requests   Final    BOTTLES DRAWN AEROBIC ONLY Blood Culture adequate volume Performed at [facility], [address], [place], [place] [address]    Culture   Final    NO GROWTH 4 DAYS Performed at [facility], [address] [place], [place] [address]    Report Status PENDING  Incomplete  Culture, blood (routine x 2)     Status: None (Preliminary result)   Collection Time: 11/05/17 11:22 AM  Result Value Ref Range Status   Specimen Description BLOOD LEFT HAND  Final   Special Requests   Final     BOTTLES DRAWN AEROBIC ONLY Blood Culture results may not be optimal due to an inadequate volume of blood received in culture bottles Performed at [facility], [address], [place], [place] [address]    Culture   Final    NO GROWTH 4 DAYS Performed at [facility], [address], [place], [place] [address]    Report Status PENDING  Incomplete  Aerobic/Anaerobic Culture (surgical/deep wound)     Status: None (Preliminary result)   Collection Time: 11/07/17  1:29 PM  Result Value Ref Range Status   Specimen Description   Final    ABSCESS RIGHT ARM Performed at [facility], [address] [place], [place] [address]    Special Requests   Final    NONE Performed at [facility], [address], [place], [place] [address]    Gram Stain   Final    ABUNDANT WBC PRESENT, PREDOMINANTLY PMN MODERATE GRAM POSITIVE COCCI Performed at [facility], [address] [place], [place] [address]    Culture   Final    MODERATE METHICILLIN RESISTANT STAPHYLOCOCCUS AUREUS   Report Status PENDING  Incomplete   Organism ID, Bacteria METHICILLIN RESISTANT STAPHYLOCOCCUS AUREUS  Final      Susceptibility   Methicillin resistant staphylococcus aureus - MIC*    CIPROFLOXACIN >=8 RESISTANT Resistant     ERYTHROMYCIN >=8 RESISTANT Resistant     GENTAMICIN <=0.5 SENSITIVE Sensitive     OXACILLIN >=4 RESISTANT Resistant     TETRACYCLINE <=1 SENSITIVE Sensitive     VANCOMYCIN <=0.5 SENSITIVE Sensitive     TRIMETH/SULFA <=10 SENSITIVE Sensitive     CLINDAMYCIN <=0.25 SENSITIVE Sensitive     RIFAMPIN <=0.5 SENSITIVE Sensitive     Inducible Clindamycin NEGATIVE Sensitive     * MODERATE METHICILLIN RESISTANT STAPHYLOCOCCUS AUREUS  MRSA PCR Screening     Status: Abnormal   Collection Time: 11/08/17 12:16 PM  Result Value Ref Range Status   MRSA by PCR POSITIVE (A) NEGATIVE Final    Comment:        The GeneXpert MRSA Assay  (FDA approved for NASAL specimens only), is one component of a comprehensive MRSA colonization surveillance program. It is not intended to diagnose MRSA infection nor to guide or monitor treatment for MRSA infections. RESULT CALLED TO, READ BACK BY AND VERIFIED WITH: Hubbard Robinson [id number] @ 1421 BY J SCOTTON Performed at [facility], [address], [place], [place] [address]   Aerobic/Anaerobic Culture (surgical/deep wound)     Status: None (Preliminary result)   Collection Time: 11/08/17  7:40 PM  Result Value Ref Range Status   Specimen Description   Final    ABSCESS RIGHT SHOULDER Performed at [facility], [address] [place], [place] [address]    Special Requests   Final    NONE Performed at [facility], [address] [place], [place] [address]    Gram Stain   Final    RARE WBC PRESENT, PREDOMINANTLY PMN RARE GRAM POSITIVE COCCI    Culture PENDING  Incomplete   Report Status PENDING  Incomplete       Radiology Studies: Ct Extrem Up Entire Arm R W/cm  Addendum Date: 11/07/2017   ADDENDUM REPORT: 11/07/2017 19:51 ADDENDUM: This addendum is given for the purpose of noting the patient has severe emphysema and a small right pleural effusion. Electronically Signed   By: Drusilla Kanner M.D.   On: 11/07/2017 19:51   Result Date: 11/07/2017 CLINICAL DATA:  Patient admitted 10/31/2017 with right upper extremity pain, swelling and weeping wounds. EXAM: CT OF THE UPPER RIGHT EXTREMITY WITH CONTRAST TECHNIQUE: Multidetector CT imaging of the upper right extremity was performed according to the standard protocol following intravenous contrast administration. COMPARISON:  CT of the right upper extremity 10/31/2017. CONTRAST:  ISOVUE-300 IOPAMIDOL (ISOVUE-300) INJECTION 61% FINDINGS: Bones/Joint/Cartilage No acute bony or joint abnormality is identified. No bony destructive change or periosteal reaction. Ligaments Suboptimally  assessed by CT. Muscles and Tendons There is a large rim enhancing fluid collection along the lateral aspect of the right upper extending into the posterior compartment of the upper arm. The collection tracks throughout the craniocaudal extent of the upper arm measuring approximately 36 cm craniocaudal. In the axial plane, the collection extends throughout the subcutaneous tissues along the lateral aspect of the upper arm across its AP diameter. The collection measures up to approximately 5.2 cm transverse in the posterior aspect of the upper arm by 7 cm AP along the proximal diaphysis of the humerus. At it's most superior point, the collection is within the deltoid muscle. More inferiorly, it extends between the heads of the triceps muscle. A focal intramuscular fluid collection measuring 1.3 cm AP by 1 cm transverse by 1.4 cm craniocaudal is seen in the superficial subcutaneous tissues of the left upper arm medially. Soft tissues The lungs are severely emphysematous. There is a small right pleural effusion. IMPRESSION: Large rim enhancing fluid collection in the posterior compartment of the right upper arm and lateral subcutaneous tissues consistent with abscess. No CT evidence of osteomyelitis is identified. As noted above, the patient's abscess extends between the heads of the triceps muscles worrisome for myositis. Abscess is also seen within the substance of the superficial deltoid posteriorly. Electronically Signed: By: Drusilla Kanner M.D. On:  11/07/2017 19:31      Scheduled Meds: . citalopram  10 mg Oral Daily  . docusate sodium  100 mg Oral BID  . enoxaparin (LOVENOX) injection  40 mg Subcutaneous Q24H  . feeding supplement (ENSURE ENLIVE)  237 mL Oral TID BM  . multivitamin with minerals  1 tablet Oral Daily  . pantoprazole  40 mg Oral Daily  . tetrabenazine  25 mg Oral BID   Continuous Infusions: . metronidazole 500 mg (11/09/17 0716)  . vancomycin Stopped (11/09/17 0144)     LOS: 9  days    Time spent: 45 minutes   Noralee Stain, DO [facility] [email or web address] Password [email or web address] 11/09/2017, 11:14 AM

## 2017-11-09 NOTE — Progress Notes (Signed)
Patient ID: Samuel Lambert, male   DOB: 02/11/1956, 62 y.o.   MRN: [id number] Subjective: 1 Day Post-Op Procedure(s) (LRB): INCISION AND DRAINAGE ABSCESS (Right)    Patient reports pain as mild to moderate.  Awake this am, comfortable.  No events reported  Objective:   VITALS:   Vitals:   11/09/17 0314 11/09/17 0537  BP: (!) 105/45 (!) 109/56  Pulse:    Resp:  20  Temp:    SpO2:      Incision: dressing C/D/I  LABS Recent Labs    11/07/17 0542 11/08/17 0713  HGB 10.8* 11.2*  HCT 33.3* 34.6*  WBC 16.9* 16.0*  PLT 519* 598*    Recent Labs    11/07/17 0542 11/08/17 0713  NA 138 139  K 3.7 3.3*  BUN 9 7  CREATININE 0.52* 0.53*  GLUCOSE 119* 124*    No results for input(s): LABPT, INR in the last 72 hours.   Assessment/Plan: 1 Day Post-Op Procedure(s) (LRB): INCISION AND DRAINAGE ABSCESS (Right)   Advance diet   Continue antibiotics, broad spectrum Cultures pending Will remove packing tomorrow and initiate wet to dry dressing changes to single area if possible I anticipate this will improve chances of improving condition of this infection

## 2017-11-10 LAB — CULTURE, BLOOD (ROUTINE X 2)
Culture: NO GROWTH
Culture: NO GROWTH
SPECIAL REQUESTS: ADEQUATE

## 2017-11-10 LAB — CBC
HCT: 28.1 % — ABNORMAL LOW (ref 39.0–52.0)
HEMOGLOBIN: 9.1 g/dL — AB (ref 13.0–17.0)
MCH: 29.4 pg (ref 26.0–34.0)
MCHC: 32.4 g/dL (ref 30.0–36.0)
MCV: 90.6 fL (ref 78.0–100.0)
Platelets: 676 10*3/uL — ABNORMAL HIGH (ref 150–400)
RBC: 3.1 MIL/uL — ABNORMAL LOW (ref 4.22–5.81)
RDW: 14.4 % (ref 11.5–15.5)
WBC: 10.1 10*3/uL (ref 4.0–10.5)

## 2017-11-10 LAB — BASIC METABOLIC PANEL
Anion gap: 6 (ref 5–15)
BUN: 10 mg/dL (ref 6–20)
CHLORIDE: 104 mmol/L (ref 98–111)
CO2: 30 mmol/L (ref 22–32)
CREATININE: 0.55 mg/dL — AB (ref 0.61–1.24)
Calcium: 7.6 mg/dL — ABNORMAL LOW (ref 8.9–10.3)
GFR calc Af Amer: >60 mL/min (ref >60)
GFR calc non Af Amer: >60 mL/min (ref >60)
Glucose, Bld: 90 mg/dL (ref 70–99)
Potassium: 3.9 mmol/L (ref 3.5–5.1)
Sodium: 140 mmol/L (ref 135–145)

## 2017-11-10 MED ORDER — CLINDAMYCIN HCL 300 MG PO CAPS
600.0000 mg | ORAL_CAPSULE | Freq: Three times a day (TID) | ORAL | Status: DC
  Administered 2017-11-10 – 2017-11-14 (×14): 600 mg via ORAL
  Filled 2017-11-10 (×16): qty 2

## 2017-11-10 MED ORDER — METRONIDAZOLE 500 MG PO TABS
500.0000 mg | ORAL_TABLET | Freq: Three times a day (TID) | ORAL | Status: DC
  Administered 2017-11-10 – 2017-11-14 (×14): 500 mg via ORAL
  Filled 2017-11-10 (×15): qty 1

## 2017-11-10 NOTE — Progress Notes (Signed)
     Subjective: 2 Days Post-Op Procedure(s) (LRB): INCISION AND DRAINAGE ABSCESS (Right)   Patient expresses that he wants pain medicine.  Patient with significant Huntington's Disease, with significant involuntary movements. Patient gave a thums up when discussing changing his dressings.  No reported events throughout the night.    Objective:   VITALS:   Vitals:   11/09/17 2222 11/10/17 0504  BP: (!) 160/91 (!) 89/75  Pulse: (!) 111 82  Resp: 17 14  Temp: (!) 97.4 F (36.3 C)   SpO2: 94% 93%   PE Compartment soft Erythema about the right tricep   LABS Recent Labs    11/08/17 0713 11/09/17 0822 11/10/17 0408  HGB 11.2* 9.8* 9.1*  HCT 34.6* 29.7* 28.1*  WBC 16.0* 12.2* 10.1  PLT 598* 674* 676*    Recent Labs    11/08/17 0713 11/09/17 0822 11/10/17 0408  NA 139 140 140  K 3.3* 3.5 3.9  BUN 7 10 10   CREATININE 0.53* 0.51* 0.55*  GLUCOSE 124* 109* 90     Assessment/Plan: 2 Days Post-Op Procedure(s) (LRB): INCISION AND DRAINAGE ABSCESS (Right)  Post-op dressing and packing removed Gauze with saline (wet to dry) was placed in the incision to keep it open. Gauze and Kerlix were then placed on top of the dressing.      Anastasio AuerbachMatthew S. Anacleto Batterman   PAC  11/10/2017, 8:44 AM

## 2017-11-10 NOTE — Progress Notes (Signed)
PROGRESS NOTE    Geneva Pallas  NWG:[id number] DOB: Aug 13, 1955 DOA: 10/31/2017 PCP: [name], PA     Brief Narrative:  [name] a 62 y.o.malewith medical history significant ofHuntington's disease, who follows up with neurology clinic.His movement disorder has been severe affecting his quality of life and overall health.He is a resident from the nursing home.Patient was brought to the hospital due to decreased p.o. intake, in the emergency department he was noted to have a right upper extremity rash. Patient reports severe pain of the right upper extremity, associated with pruritus, no improving or worsening factors, denies any other associated symptoms,symptoms present for last few days. Unable to get a clear history due to patient's current cognitive dysfunction.He underwent CT RUE which initially did not show abscess. Repeat CT revealed abscess. He underwent I&D with [name] on 7/4.   New events last 24 hours / Subjective: No new events. Orthopedic PA at bedside changing dressing. Patient complains of pain.   Assessment & Plan:   Active Problems:   Sepsis (HCC)  Sepsis secondary to right upper extreme the cellulitis/myositis/abscess  -Patient presented with tachycardia, tachypnea, elevated lactic acid -CT right upper extremity 6/24: Moderate circumferential soft tissue swelling about the upper arm, can be seen with cellulitis, venous insufficiency or third spacing.  No abscess.  Ill-defined hypodensity throughout the triceps musculature, may reflect underlying myositis.  No osseous abnormality. 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 I spoke with [name], he recommends continuing flagyl for couple more weeks.  -Repeat blood cultures on 6/29 show no growth to date -Repeat CT scan 7/1: Large rim-enhancing fluid collection in the posterior compartment of the right upper arm and lateral subcutaneous tissues  consistent with abscess.  Abscess is also seen in the substance of the superficial deltoid posteriorly. No osteomyelitis. -S/p I&D with 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  ID curb-side [name]   Procedures:   7/2 PROCEDURE: 1.  Excisional debridement of right arm wound including skin and a 6-inch incision, nonviable subcutaneous tissue, muscle and fascia. 2.  Nonexcisional debridement of right arm abscess with 3 L of normal saline solution with pulse lavage.    Antimicrobials:  Anti-infectives (From admission, onward)   Start     Dose/Rate Route Frequency Ordered Stop   11/10/17 1400  metroNIDAZOLE (FLAGYL) tablet 500 mg     500 mg Oral Every 8 hours 11/10/17 0940     11/10/17 1000  clindamycin (CLEOCIN) capsule 600 mg     600 mg Oral Every 8 hours 11/10/17 0940     11/08/17 1944  polymyxin B 500,000 Units, bacitracin 50,000 Units in sodium chloride 0.9 % 500 mL irrigation  Status:  Discontinued       As needed  11/08/17 1944 11/08/17 2015   11/08/17 0000  vancomycin (VANCOCIN) IVPB 750 mg/150  ml premix  Status:  Discontinued     750 mg 150 mL/hr over 60 Minutes Intravenous Every 12 hours 11/07/17 0943 11/10/17 0940   11/07/17 1030  vancomycin (VANCOCIN) 1,250 mg in sodium chloride 0.9 % 250 mL IVPB     1,250 mg 166.7 mL/hr over 90 Minutes Intravenous  Once 11/07/17 0943 11/07/17 1125   11/05/17 1200  metroNIDAZOLE (FLAGYL) IVPB 500 mg  Status:  Discontinued     500 mg 100 mL/hr over 60 Minutes Intravenous Every 8 hours 11/05/17 1041 11/10/17 0940   11/02/17 1200  cefTRIAXone (ROCEPHIN) 1 g in sodium chloride 0.9 % 100 mL IVPB  Status:  Discontinued     1 g 200 mL/hr over 30 Minutes Intravenous Every 24 hours 11/02/17 0911 11/09/17 1110   11/01/17 2200  vancomycin (VANCOCIN) 1,250 mg in sodium chloride 0.9 % 250 mL IVPB  Status:  Discontinued     1,250 mg 166.7 mL/hr over 90 Minutes Intravenous Every 48 hours 10/31/17 2142 11/02/17 0911   11/01/17 0400  piperacillin-tazobactam (ZOSYN) IVPB 3.375 g  Status:  Discontinued     3.375 g 12.5 mL/hr over 240 Minutes Intravenous Every 8 hours 10/31/17 2142 11/02/17 0911   10/31/17 2130  piperacillin-tazobactam (ZOSYN) IVPB 3.375 g  Status:  Discontinued     3.375 g 100 mL/hr over 30 Minutes Intravenous  Once 10/31/17 2115 10/31/17 2115   10/31/17 2130  vancomycin (VANCOCIN) IVPB 1000 mg/200 mL premix  Status:  Discontinued     1,000 mg 200 mL/hr over 60 Minutes Intravenous  Once 10/31/17 2115 10/31/17 2115   10/31/17 1815  piperacillin-tazobactam (ZOSYN) IVPB 3.375 g     3.375 g 100 mL/hr over 30 Minutes Intravenous NOW 10/31/17 1812 10/31/17 2350   10/31/17 1100  piperacillin-tazobactam (ZOSYN) IVPB 3.375 g     3.375 g 100 mL/hr over 30 Minutes Intravenous  Once 10/31/17 1049 10/31/17 1152   10/31/17 1100  vancomycin (VANCOCIN) IVPB 1000 mg/200 mL premix     1,000 mg 200 mL/hr over 60 Minutes Intravenous  Once 10/31/17 1049 10/31/17 1220       Objective: Vitals:   11/09/17 0537 11/09/17 1306 11/09/17 2222 11/10/17 0504    BP: (!) 109/56 109/90 (!) 160/91 (!) 89/75  Pulse:  92 (!) 111 82  Resp: 20 16 17 14   Temp:  97.8 F (36.6 C) (!) 97.4 F (36.3 C)   TempSrc:  Oral Axillary   SpO2:  99% 94% 93%  Weight:      Height:        Intake/Output Summary (Last 24 hours) at 11/10/2017 1113 Last data filed at 11/10/2017 0506 Gross per 24 hour  Intake 720 ml  Output 500 ml  Net 220 ml   Filed Weights   11/01/17 1425 11/08/17 1755 11/08/17 2116  Weight: 65.8 kg (145 lb) 65.8 kg (145 lb) 72.6 kg (160 lb)    Examination: General exam: Appears calm, with generalized chorea-form movements  Respiratory system: Clear to auscultation. Respiratory effort normal. Cardiovascular system: S1 & S2 heard, RRR. No JVD, murmurs, rubs, gallops or clicks. No pedal edema. Gastrointestinal system: Abdomen is nondistended, soft and nontender. No organomegaly or masses felt. Normal bowel sounds heard. Central nervous system: Alert, cognition impaired somewhat but able to answer some questions, asking for pain medication  Extremities: Symmetric Skin: +RUE dressing with serosanguinous fluid, erythema appears about the same as previous exam  Data Reviewed: I have personally reviewed following labs and imaging studies  CBC: Recent Labs  Lab 11/06/17 0520 11/07/17 0542 11/08/17 0713 11/09/17 0822 11/10/17 0408  WBC 17.7* 16.9* 16.0* 12.2* 10.1  HGB 11.6* 10.8* 11.2* 9.8* 9.1*  HCT 35.1* 33.3* 34.6* 29.7* 28.1*  MCV 90.5 93.0 92.5 92.0 90.6  PLT 407* 519* 598* 674* 676*   Basic Metabolic Panel: Recent Labs  Lab 11/06/17 0520 11/07/17 0542 11/08/17 0713 11/09/17 0822 11/10/17 0408  NA 140 138 139 140 140  K 3.7 3.7 3.3* 3.5 3.9  CL 105 102 102 104 104  CO2 29 27 30 29 30   GLUCOSE 125* 119* 124* 109* 90  BUN 9 9 7 10 10   CREATININE 0.53* 0.52* 0.53* 0.51* 0.55*  CALCIUM 7.7* 7.6* 7.5* 7.5* 7.6*   GFR: Estimated Creatinine Clearance: 100.8 mL/min (A) (by C-G formula based on SCr of 0.55 mg/dL (L)). Liver  Function Tests: No results for input(s): AST, ALT, ALKPHOS, BILITOT, PROT, ALBUMIN in the last 168 hours. No results for input(s): LIPASE, AMYLASE in the last 168 hours. No results for input(s): AMMONIA in the last 168 hours. Coagulation Profile: No results for input(s): INR, PROTIME in the last 168 hours. Cardiac Enzymes: No results for input(s): CKTOTAL, CKMB, CKMBINDEX, TROPONINI in the last 168 hours. BNP (last 3 results) No results for input(s): PROBNP in the last 8760 hours. HbA1C: No results for input(s): HGBA1C in the last 72 hours. CBG: No results for input(s): GLUCAP in the last 168 hours. Lipid Profile: No results for input(s): CHOL, HDL, LDLCALC, TRIG, CHOLHDL, LDLDIRECT in the last 72 hours. Thyroid Function Tests: No results for input(s): TSH, T4TOTAL, FREET4, T3FREE, THYROIDAB in the last 72 hours. Anemia Panel: No results for input(s): VITAMINB12, FOLATE, FERRITIN, TIBC, IRON, RETICCTPCT in the last 72 hours. Sepsis Labs: No results for input(s): PROCALCITON, LATICACIDVEN in the last 168 hours.  Recent Results (from the past 240 hour(s))  Urine culture     Status: None   Collection Time: 10/31/17  3:29 PM  Result Value Ref Range Status   Specimen Description   Final    URINE, CATHETERIZED Performed at [facility], [address] [place], [place] [address]    Special Requests   Final    Normal Performed at [facility], [address] [place], [place] [address]    Culture   Final    NO GROWTH Performed at [facility], [address] [place], [place] [address]    Report Status 11/01/2017 FINAL  Final  MRSA PCR Screening     Status: Abnormal   Collection Time: 10/31/17  9:30 PM  Result Value Ref Range Status   MRSA by PCR POSITIVE (A) NEGATIVE Final    Comment:        The GeneXpert MRSA Assay (FDA approved for NASAL specimens only), is one component of a comprehensive MRSA colonization surveillance program.  It is not intended to diagnose MRSA infection nor to guide or monitor treatment for MRSA infections. RESULT CALLED TO, READ BACK BY AND VERIFIED WITH: H.RICHARD RN AT [phone] ON 11/01/17 BY S.VANHOORNE Performed at [facility], [address] [place], [place] [address]   Culture, blood (routine x 2)     Status: None (Preliminary result)   Collection Time: 11/05/17 11:21 AM  Result Value Ref Range Status   Specimen Description BLOOD LEFT ARM  Final   Special Requests   Final    BOTTLES DRAWN  AEROBIC ONLY Blood Culture adequate volume Performed at [facility], [address] [place], [place] [address]    Culture   Final    NO GROWTH 4 DAYS Performed at [facility], [address], [place], [place] [address]    Report Status PENDING  Incomplete  Culture, blood (routine x 2)     Status: None (Preliminary result)   Collection Time: 11/05/17 11:22 AM  Result Value Ref Range Status   Specimen Description BLOOD LEFT HAND  Final   Special Requests   Final    BOTTLES DRAWN AEROBIC ONLY Blood Culture results may not be optimal due to an inadequate volume of blood received in culture bottles Performed at [facility], [address], [place], [place] [address]    Culture   Final    NO GROWTH 4 DAYS Performed at [facility], [address], [place], [place] [address]    Report Status PENDING  Incomplete  Aerobic/Anaerobic Culture (surgical/deep wound)     Status: None (Preliminary result)   Collection Time: 11/07/17  1:29 PM  Result Value Ref Range Status   Specimen Description   Final    ABSCESS RIGHT ARM Performed at [facility], [address], [place], [place] [address]    Special Requests   Final    NONE Performed at [facility], [address], [place], [place] [address]    Gram Stain   Final    ABUNDANT WBC PRESENT, PREDOMINANTLY PMN MODERATE GRAM POSITIVE  COCCI Performed at [facility], [address], [place], [place] [address]    Culture   Final    MODERATE METHICILLIN RESISTANT STAPHYLOCOCCUS AUREUS NO ANAEROBES ISOLATED; CULTURE IN PROGRESS FOR 5 DAYS    Report Status PENDING  Incomplete   Organism ID, Bacteria METHICILLIN RESISTANT STAPHYLOCOCCUS AUREUS  Final      Susceptibility   Methicillin resistant staphylococcus aureus - MIC*    CIPROFLOXACIN >=8 RESISTANT Resistant     ERYTHROMYCIN >=8 RESISTANT Resistant     GENTAMICIN <=0.5 SENSITIVE Sensitive     OXACILLIN >=4 RESISTANT Resistant     TETRACYCLINE <=1 SENSITIVE Sensitive     VANCOMYCIN <=0.5 SENSITIVE Sensitive     TRIMETH/SULFA <=10 SENSITIVE Sensitive     CLINDAMYCIN <=0.25 SENSITIVE Sensitive     RIFAMPIN <=0.5 SENSITIVE Sensitive     Inducible Clindamycin NEGATIVE Sensitive     * MODERATE METHICILLIN RESISTANT STAPHYLOCOCCUS AUREUS  MRSA PCR Screening     Status: Abnormal   Collection Time: 11/08/17 12:16 PM  Result Value Ref Range Status   MRSA by PCR POSITIVE (A) NEGATIVE Final    Comment:        The GeneXpert MRSA Assay (FDA approved for NASAL specimens only), is one component of a comprehensive MRSA colonization surveillance program. It is not intended to diagnose MRSA infection nor to guide or monitor treatment for MRSA infections. RESULT CALLED TO, READ BACK BY AND VERIFIED WITH: Hubbard Robinson [id number] @ 1421 BY J SCOTTON Performed at [facility], [address] [place], [place] [address]   Aerobic/Anaerobic Culture (surgical/deep wound)     Status: None (Preliminary result)   Collection Time: 11/08/17  7:40 PM  Result Value Ref Range Status   Specimen Description   Final    ABSCESS RIGHT SHOULDER Performed at [facility], [address], [place], [place] [address]    Special Requests   Final  NONE Performed at [facility], [address], [place], [place] [address]    Gram Stain   Final     RARE WBC PRESENT, PREDOMINANTLY PMN RARE GRAM POSITIVE COCCI    Culture   Final    TOO YOUNG TO READ Performed at [facility], [address] [place], [place] [address]    Report Status PENDING  Incomplete       Radiology Studies: No results found.    Scheduled Meds: . citalopram  10 mg Oral Daily  . clindamycin  600 mg Oral Q8H  . docusate sodium  100 mg Oral BID  . enoxaparin (LOVENOX) injection  40 mg Subcutaneous Q24H  . feeding supplement (ENSURE ENLIVE)  237 mL Oral TID BM  . metroNIDAZOLE  500 mg Oral Q8H  . multivitamin with minerals  1 tablet Oral Daily  . pantoprazole  40 mg Oral Daily  . tetrabenazine  25 mg Oral BID   Continuous Infusions:    LOS: 10 days    Time spent: 25 minutes   Noralee Stain, DO [facility] [email or web address] Password [email or web address] 11/10/2017, 11:13 AM

## 2017-11-10 NOTE — Progress Notes (Addendum)
Pt pulled his PIV out earlier today. I have paged Dr Alvino ChapelHOI

## 2017-11-11 DIAGNOSIS — A4102 Sepsis due to Methicillin resistant Staphylococcus aureus: Secondary | ICD-10-CM

## 2017-11-11 LAB — BASIC METABOLIC PANEL
Anion gap: 7 (ref 5–15)
BUN: 6 mg/dL (ref 6–20)
CHLORIDE: 101 mmol/L (ref 98–111)
CO2: 30 mmol/L (ref 22–32)
CREATININE: 0.57 mg/dL — AB (ref 0.61–1.24)
Calcium: 7.8 mg/dL — ABNORMAL LOW (ref 8.9–10.3)
GFR calc non Af Amer: >60 mL/min (ref >60)
Glucose, Bld: 109 mg/dL — ABNORMAL HIGH (ref 70–99)
Potassium: 3.6 mmol/L (ref 3.5–5.1)
Sodium: 138 mmol/L (ref 135–145)

## 2017-11-11 LAB — CBC
HEMATOCRIT: 29.8 % — AB (ref 39.0–52.0)
HEMOGLOBIN: 9.7 g/dL — AB (ref 13.0–17.0)
MCH: 30 pg (ref 26.0–34.0)
MCHC: 32.6 g/dL (ref 30.0–36.0)
MCV: 92.3 fL (ref 78.0–100.0)
Platelets: 788 10*3/uL — ABNORMAL HIGH (ref 150–400)
RBC: 3.23 MIL/uL — ABNORMAL LOW (ref 4.22–5.81)
RDW: 14.6 % (ref 11.5–15.5)
WBC: 12.3 10*3/uL — ABNORMAL HIGH (ref 4.0–10.5)

## 2017-11-11 MED ORDER — METRONIDAZOLE 500 MG PO TABS: 500.0000 mg | ORAL_TABLET | Freq: Three times a day (TID) | ORAL | 0 refills | Status: AC

## 2017-11-11 MED ORDER — CLINDAMYCIN HCL 300 MG PO CAPS: 600.0000 mg | ORAL_CAPSULE | Freq: Three times a day (TID) | ORAL | 0 refills | Status: AC

## 2017-11-11 MED ORDER — OXYCODONE HCL 5 MG PO TABS: 5.0000 mg | ORAL_TABLET | Freq: Four times a day (QID) | ORAL | 0 refills | Status: AC | PRN

## 2017-11-11 NOTE — Progress Notes (Signed)
     Subjective: 3 Days Post-Op Procedure(s) (LRB): INCISION AND DRAINAGE ABSCESS (Right)   Patient resting comfortably in bed. No reported events throughout the night.   Objective:   VITALS:   Vitals:   11/10/17 2002 11/11/17 0500  BP: 136/72 109/62  Pulse: 84 82  Resp: 16 14  Temp: 98.9 F (37.2 C) 98.2 F (36.8 C)  SpO2: 100% 100%    Incision: dressing C/D/I Compartment soft  LABS Recent Labs    11/09/17 0822 11/10/17 0408 11/11/17 0426  HGB 9.8* 9.1* 9.7*  HCT 29.7* 28.1* 29.8*  WBC 12.2* 10.1 12.3*  PLT 674* 676* 788*    Recent Labs    11/09/17 0822 11/10/17 0408 11/11/17 0426  NA 140 140 138  K 3.5 3.9 3.6  BUN 10 10 6   CREATININE 0.51* 0.55* 0.57*  GLUCOSE 109* 90 109*     Assessment/Plan: 3 Days Post-Op Procedure(s) (LRB): INCISION AND DRAINAGE ABSCESS (Right)   Continue antibiotics, broad spectrum Packing removed yesterday Continue wet to dry dressing changes to single area if possible Anticipation this will improve chances of improving condition of this infection    Anastasio AuerbachMatthew S. Lukisha Procida   PAC  11/11/2017, 7:44 AM

## 2017-11-11 NOTE — Progress Notes (Signed)
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 intake, in the emergency department he was noted to have a right upper extremity rash. Patient reports severe pain of the right upper extremity, associated with pruritus, no improving or worsening factors, denies any other associated symptoms,symptoms present for last few days. Unable to get a clear history due to patient's current cognitive dysfunction.He underwent CT RUE which initially did not show abscess. Repeat CT revealed abscess. He underwent I&D with Dr. Charlann Boxerlin on 7/4.   New events last 24 hours / Subjective: No new issues overnight. IV access lost. Remains afebrile.   Assessment & Plan:   Active Problems:   Sepsis (HCC)  Sepsis secondary to right upper extreme the cellulitis/myositis/abscess  -Patient presented with tachycardia, tachypnea, elevated lactic acid -CT right upper extremity 6/24: Moderate circumferential soft tissue swelling about the upper arm, can be seen with cellulitis, venous insufficiency or third spacing.  No abscess.  Ill-defined hypodensity throughout the triceps musculature, may reflect underlying myositis.  No osseous abnormality. 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 I spoke with Dr. Ninetta LightsHatcher, he recommends continuing flagyl for couple more weeks.  -Repeat blood cultures on 6/29 show no growth to date -Repeat CT scan 7/1: Large rim-enhancing fluid collection in the posterior compartment of the right upper arm and lateral subcutaneous tissues consistent with abscess.  Abscess  is also seen in the substance of the superficial deltoid posteriorly. No osteomyelitis. -S/p I&D with Dr. 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 Charlann Boxerlin  ID curb-side Dr. Ninetta LightsHatcher   Procedures:   7/2 PROCEDURE: 1.  Excisional debridement of right arm wound including skin and a 6-inch incision, nonviable subcutaneous tissue, muscle and fascia. 2.  Nonexcisional debridement of right arm abscess with 3 L of normal saline solution with pulse lavage.    Antimicrobials:  Anti-infectives (From admission, onward)   Start     Dose/Rate Route Frequency Ordered Stop   11/11/17 0000  clindamycin (CLEOCIN) 300 MG capsule     600 mg Oral Every 8 hours 11/11/17 1005 11/18/17 2359   11/11/17 0000  metroNIDAZOLE (FLAGYL) 500 MG tablet     500 mg Oral Every 8 hours 11/11/17 1005 11/25/17 2359   11/10/17 1400  metroNIDAZOLE (FLAGYL) tablet 500 mg     500 mg Oral Every 8 hours 11/10/17 0940     11/10/17 1000  clindamycin (CLEOCIN) capsule 600 mg  600 mg Oral Every 8 hours 11/10/17 0940     11/08/17 1944  polymyxin B 500,000 Units, bacitracin  50,000 Units in sodium chloride 0.9 % 500 mL irrigation  Status:  Discontinued       As needed 11/08/17 1944 11/08/17 2015   11/08/17 0000  vancomycin (VANCOCIN) IVPB 750 mg/150 ml premix  Status:  Discontinued     750 mg 150 mL/hr over 60 Minutes Intravenous Every 12 hours 11/07/17 0943 11/10/17 0940   11/07/17 1030  vancomycin (VANCOCIN) 1,250 mg in sodium chloride 0.9 % 250 mL IVPB     1,250 mg 166.7 mL/hr over 90 Minutes Intravenous  Once 11/07/17 0943 11/07/17 1125   11/05/17 1200  metroNIDAZOLE (FLAGYL) IVPB 500 mg  Status:  Discontinued     500 mg 100 mL/hr over 60 Minutes Intravenous Every 8 hours 11/05/17 1041 11/10/17 0940   11/02/17 1200  cefTRIAXone (ROCEPHIN) 1 g in sodium chloride 0.9 % 100 mL IVPB  Status:  Discontinued     1 g 200 mL/hr over 30 Minutes Intravenous Every 24 hours 11/02/17 0911 11/09/17 1110   11/01/17 2200  vancomycin (VANCOCIN) 1,250 mg in sodium chloride 0.9 % 250 mL IVPB  Status:  Discontinued     1,250 mg 166.7 mL/hr over 90 Minutes Intravenous Every 48 hours 10/31/17 2142 11/02/17 0911   11/01/17 0400  piperacillin-tazobactam (ZOSYN) IVPB 3.375 g  Status:  Discontinued     3.375 g 12.5 mL/hr over 240 Minutes Intravenous Every 8 hours 10/31/17 2142 11/02/17 0911   10/31/17 2130  piperacillin-tazobactam (ZOSYN) IVPB 3.375 g  Status:  Discontinued     3.375 g 100 mL/hr over 30 Minutes Intravenous  Once 10/31/17 2115 10/31/17 2115   10/31/17 2130  vancomycin (VANCOCIN) IVPB 1000 mg/200 mL premix  Status:  Discontinued     1,000 mg 200 mL/hr over 60 Minutes Intravenous  Once 10/31/17 2115 10/31/17 2115   10/31/17 1815  piperacillin-tazobactam (ZOSYN) IVPB 3.375 g     3.375 g 100 mL/hr over 30 Minutes Intravenous NOW 10/31/17 1812 10/31/17 2350   10/31/17 1100  piperacillin-tazobactam (ZOSYN) IVPB 3.375 g     3.375 g 100 mL/hr over 30 Minutes Intravenous  Once 10/31/17 1049 10/31/17 1152   10/31/17 1100  vancomycin (VANCOCIN) IVPB 1000 mg/200 mL premix      1,000 mg 200 mL/hr over 60 Minutes Intravenous  Once 10/31/17 1049 10/31/17 1220       Objective: Vitals:   11/10/17 1515 11/10/17 2002 11/11/17 0500 11/11/17 1448  BP:  136/72 109/62 110/64  Pulse: 79 84 82 80  Resp: 20 16 14 16   Temp:  98.9 F (37.2 C) 98.2 F (36.8 C) 98.4 F (36.9 C)  TempSrc:  Oral Axillary Axillary  SpO2: (!) 86% 100% 100% 100%  Weight:      Height:        Intake/Output Summary (Last 24 hours) at 11/11/2017 1742 Last data filed at 11/11/2017 0520 Gross per 24 hour  Intake -  Output 800 ml  Net -800 ml   Filed Weights   11/01/17 1425 11/08/17 1755 11/08/17 2116  Weight: 65.8 kg (145 lb) 65.8 kg (145 lb) 72.6 kg (160 lb)    Examination: General exam: Appears calm, alert, involuntary movements  Respiratory system: Clear to auscultation. Respiratory effort normal. Cardiovascular system: S1 & S2 heard, RRR. No JVD, murmurs, rubs, gallops or clicks. No pedal edema. Gastrointestinal system: Abdomen is nondistended, soft and nontender. No organomegaly or masses felt. Normal  bowel sounds heard. Central nervous system: Alert  Extremities: Symmetric in appearance  Skin: RUE dressing is soaked in brown/serosangenous fluid, erythema seems to have improved    Data Reviewed: I have personally reviewed following labs and imaging studies  CBC: Recent Labs  Lab 11/07/17 0542 11/08/17 0713 11/09/17 0822 11/10/17 0408 11/11/17 0426  WBC 16.9* 16.0* 12.2* 10.1 12.3*  HGB 10.8* 11.2* 9.8* 9.1* 9.7*  HCT 33.3* 34.6* 29.7* 28.1* 29.8*  MCV 93.0 92.5 92.0 90.6 92.3  PLT 519* 598* 674* 676* 788*   Basic Metabolic Panel: Recent Labs  Lab 11/07/17 0542 11/08/17 0713 11/09/17 0822 11/10/17 0408 11/11/17 0426  NA 138 139 140 140 138  K 3.7 3.3* 3.5 3.9 3.6  CL 102 102 104 104 101  CO2 27 30 29 30 30   GLUCOSE 119* 124* 109* 90 109*  BUN 9 7 10 10 6   CREATININE 0.52* 0.53* 0.51* 0.55* 0.57*  CALCIUM 7.6* 7.5* 7.5* 7.6* 7.8*   GFR: Estimated  Creatinine Clearance: 100.8 mL/min (A) (by C-G formula based on SCr of 0.57 mg/dL (L)). Liver Function Tests: No results for input(s): AST, ALT, ALKPHOS, BILITOT, PROT, ALBUMIN in the last 168 hours. No results for input(s): LIPASE, AMYLASE in the last 168 hours. No results for input(s): AMMONIA in the last 168 hours. Coagulation Profile: No results for input(s): INR, PROTIME in the last 168 hours. Cardiac Enzymes: No results for input(s): CKTOTAL, CKMB, CKMBINDEX, TROPONINI in the last 168 hours. BNP (last 3 results) No results for input(s): PROBNP in the last 8760 hours. HbA1C: No results for input(s): HGBA1C in the last 72 hours. CBG: No results for input(s): GLUCAP in the last 168 hours. Lipid Profile: No results for input(s): CHOL, HDL, LDLCALC, TRIG, CHOLHDL, LDLDIRECT in the last 72 hours. Thyroid Function Tests: No results for input(s): TSH, T4TOTAL, FREET4, T3FREE, THYROIDAB in the last 72 hours. Anemia Panel: No results for input(s): VITAMINB12, FOLATE, FERRITIN, TIBC, IRON, RETICCTPCT in the last 72 hours. Sepsis Labs: No results for input(s): PROCALCITON, LATICACIDVEN in the last 168 hours.  Recent Results (from the past 240 hour(s))  Culture, blood (routine x 2)     Status: None   Collection Time: 11/05/17 11:21 AM  Result Value Ref Range Status   Specimen Description BLOOD LEFT ARM  Final   Special Requests   Final    BOTTLES DRAWN AEROBIC ONLY Blood Culture adequate volume Performed at [facility], [address], [place], [place] [address]    Culture   Final    NO GROWTH 5 DAYS Performed at [facility], [address] [place], [place] [address]    Report Status 11/10/2017 FINAL  Final  Culture, blood (routine x 2)     Status: None   Collection Time: 11/05/17 11:22 AM  Result Value Ref Range Status   Specimen Description BLOOD LEFT HAND  Final   Special Requests   Final    BOTTLES DRAWN AEROBIC ONLY Blood Culture results may not be  optimal due to an inadequate volume of blood received in culture bottles Performed at [facility], [address], [place], [place] [address]    Culture   Final    NO GROWTH 5 DAYS Performed at [facility], [address], [place], [place] [address]    Report Status 11/10/2017 FINAL  Final  Aerobic/Anaerobic Culture (surgical/deep wound)     Status: None (Preliminary result)   Collection Time: 11/07/17  1:29 PM  Result Value Ref Range Status   Specimen Description   Final    ABSCESS RIGHT ARM Performed at [facility], [address] [place], [place] [address]    Special Requests   Final    NONE Performed at [facility], [address] [place], [place] [address]    Gram Stain   Final    ABUNDANT WBC PRESENT, PREDOMINANTLY PMN MODERATE GRAM POSITIVE COCCI Performed at [facility], [address] [place], [place] [address]    Culture   Final    MODERATE METHICILLIN RESISTANT STAPHYLOCOCCUS AUREUS NO ANAEROBES ISOLATED; CULTURE IN PROGRESS FOR 5 DAYS    Report Status PENDING  Incomplete   Organism ID, Bacteria METHICILLIN RESISTANT STAPHYLOCOCCUS AUREUS  Final      Susceptibility   Methicillin resistant staphylococcus aureus - MIC*    CIPROFLOXACIN >=8 RESISTANT Resistant     ERYTHROMYCIN >=8 RESISTANT Resistant     GENTAMICIN <=0.5 SENSITIVE Sensitive     OXACILLIN >=4 RESISTANT Resistant     TETRACYCLINE <=1 SENSITIVE Sensitive     VANCOMYCIN <=0.5 SENSITIVE Sensitive     TRIMETH/SULFA <=10 SENSITIVE Sensitive     CLINDAMYCIN <=0.25 SENSITIVE Sensitive     RIFAMPIN <=0.5 SENSITIVE Sensitive     Inducible Clindamycin NEGATIVE Sensitive     * MODERATE METHICILLIN RESISTANT STAPHYLOCOCCUS AUREUS  MRSA PCR Screening     Status: Abnormal   Collection Time: 11/08/17 12:16 PM  Result Value Ref Range Status   MRSA by PCR POSITIVE (A) NEGATIVE Final    Comment:        The GeneXpert MRSA Assay  (FDA approved for NASAL specimens only), is one component of a comprehensive MRSA colonization surveillance program. It is not intended to diagnose MRSA infection nor to guide or monitor treatment for MRSA infections. RESULT CALLED TO, READ BACK BY AND VERIFIED WITH: Hubbard Robinson [id number] @ 1421 BY J SCOTTON Performed at [facility], [address] [place], [place] [address]   Aerobic/Anaerobic Culture (surgical/deep wound)     Status: None (Preliminary result)   Collection Time: 11/08/17  7:40 PM  Result Value Ref Range Status   Specimen Description   Final    ABSCESS RIGHT SHOULDER Performed at [facility], [address], [place], [place] [address]    Special Requests   Final    NONE Performed at [facility], [address], [place], [place] [address]    Gram Stain   Final    RARE WBC PRESENT, PREDOMINANTLY PMN RARE GRAM POSITIVE COCCI    Culture   Final    FEW METHICILLIN RESISTANT STAPHYLOCOCCUS AUREUS NO ANAEROBES ISOLATED; CULTURE IN PROGRESS FOR 5 DAYS    Report Status PENDING  Incomplete   Organism ID, Bacteria METHICILLIN RESISTANT STAPHYLOCOCCUS AUREUS  Final      Susceptibility   Methicillin resistant staphylococcus aureus - MIC*    CIPROFLOXACIN >=8 RESISTANT Resistant     ERYTHROMYCIN >=8 RESISTANT Resistant     GENTAMICIN <=0.5 SENSITIVE Sensitive     OXACILLIN >=4 RESISTANT Resistant     TETRACYCLINE <=1 SENSITIVE Sensitive     VANCOMYCIN 1 SENSITIVE Sensitive     TRIMETH/SULFA <=10 SENSITIVE Sensitive     CLINDAMYCIN <=0.25 SENSITIVE Sensitive     RIFAMPIN <=0.5 SENSITIVE Sensitive     Inducible Clindamycin NEGATIVE Sensitive     * FEW METHICILLIN RESISTANT STAPHYLOCOCCUS AUREUS       Radiology Studies: No  results found.    Scheduled Meds: . citalopram  10 mg Oral Daily  . clindamycin  600 mg Oral Q8H  . docusate sodium  100 mg Oral BID  . enoxaparin (LOVENOX) injection  40 mg Subcutaneous  Q24H  . feeding supplement (ENSURE ENLIVE)  237 mL Oral TID BM  . metroNIDAZOLE  500 mg Oral Q8H  . multivitamin with minerals  1 tablet Oral Daily  . pantoprazole  40 mg Oral Daily  . tetrabenazine  25 mg Oral BID   Continuous Infusions:    LOS: 11 days    Time spent: 25 minutes   Noralee Stain, DO [facility] [email or web address] Password [email or web address] 11/11/2017, 5:42 PM

## 2017-11-12 LAB — CBC
HCT: 29.4 % — ABNORMAL LOW (ref 39.0–52.0)
HEMOGLOBIN: 9.5 g/dL — AB (ref 13.0–17.0)
MCH: 29.9 pg (ref 26.0–34.0)
MCHC: 32.3 g/dL (ref 30.0–36.0)
MCV: 92.5 fL (ref 78.0–100.0)
Platelets: 821 10*3/uL — ABNORMAL HIGH (ref 150–400)
RBC: 3.18 MIL/uL — AB (ref 4.22–5.81)
RDW: 14.8 % (ref 11.5–15.5)
WBC: 10.4 10*3/uL (ref 4.0–10.5)

## 2017-11-12 LAB — BASIC METABOLIC PANEL
ANION GAP: 7 (ref 5–15)
BUN: 8 mg/dL (ref 6–20)
CALCIUM: 7.8 mg/dL — AB (ref 8.9–10.3)
CO2: 28 mmol/L (ref 22–32)
CREATININE: 0.65 mg/dL (ref 0.61–1.24)
Chloride: 103 mmol/L (ref 98–111)
Glucose, Bld: 131 mg/dL — ABNORMAL HIGH (ref 70–99)
Potassium: 3.9 mmol/L (ref 3.5–5.1)
SODIUM: 138 mmol/L (ref 135–145)

## 2017-11-12 LAB — AEROBIC/ANAEROBIC CULTURE (SURGICAL/DEEP WOUND)

## 2017-11-12 LAB — AEROBIC/ANAEROBIC CULTURE W GRAM STAIN (SURGICAL/DEEP WOUND)

## 2017-11-12 NOTE — Clinical Social Work Note (Addendum)
Patient medically stable for discharge back to [facility] today. Facility contacted and d/c clinicals transmitted. CSW talked with staff person this afternoon regarding patient's discharge and was advised that contact would be made after clinicals reviewed. Contact made again with Inetta Fermoina at [facility] at 5:32 pm regarding discharge and was advised that their staff cannot provide the wound care the patient needs and they would need to contact [facility] regarding the wound care. According to Inetta Fermoina, the care manager at their facility is saying that patient would not be able to return until Monday due to the wound care needs and having to contact [facility]. CSW reviewed d/c summary and per MD:   Change dressing (specify)    Comments:  Continue wet-to-dry dressing changes on the right upper extremity every shift until follow up outpatient.   11/12/17 [id number]     CSW consulted with Cathlean CowerAlesia, nurse case manager regarding patient and wound care and she made contact with [facility], and they will have to review the referral and start of care will not begin until Monday or Tuesday of next week. CSW contacted Dr. Alvino Chapelhoi (6:11 pm) and updated her. MD and CSW discussed the possibility of SNF for the wound care. CSW reviewed patient's contacts and Mariel KanskyMartha Clark is listed and her phone number is [phone]. Ms. Chestine SporeClark is a Department of Social Services employee with [employer] per her voice mail. CSW visited with patient and informed him that he is not discharging today. Patient appeared to understand what CSW explained to him, but is not sure if he is able to make a decision on SNF placement without assistance.  A handoff provided to Child psychotherapistsocial worker and work will continue on patient's discharge.  Genelle BalVanessa Zyere Jiminez, MSW, LCSW Licensed Clinical Social Worker Clinical Social Work Department [facility] [phone]

## 2017-11-12 NOTE — Discharge Summary (Addendum)
Physician Discharge Summary  Samuel Lambert ZOX:[id number] DOB: 1956/01/30 DOA: 10/31/2017  PCP: [name], PA  Admit date: 10/31/2017 Discharge date: 11/15/2017  Admitted From: ALF  Disposition:  ALF   Recommendations for Outpatient Follow-up:  1. Follow up with PCP in 1 week 2. Follow up with Orthopedic surgery [name] for post-op follow up in 7-10 days  Discharge Condition: Stable CODE STATUS: Full  Diet recommendation: Regular diet   Wound dressing change: Continue wet-to-dry dressing changes on the RUE daily until follow up outpatient.   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 intake, in the emergency department he was noted to have a right upper extremity rash. Patient reports severe pain of the right upper extremity, associated with pruritus, no improving or worsening factors, denies any other associated symptoms,symptoms present for last few days. Unable to get a clear history due to patient's current cognitive dysfunction.He underwent CT RUE which initially did not show abscess. Repeat CT revealed abscess. He underwent I&D with [name] on 7/4. Deep wound culture was positive for MRSA. His IV vancomycin was deescalated to clindamycin. His WBC continued to improve.  Discharge Diagnoses:  Active Problems:   Sepsis (HCC)  Sepsis secondary to right upper extreme the cellulitis/myositis/abscess  -Patient presented with tachycardia, tachypnea, elevated lactic acid -CT right upper extremity 6/24: Moderate circumferential soft tissue swelling about the upper arm, can be seen with cellulitis, venous insufficiency or third spacing. No abscess. Ill-defined hypodensity throughout the triceps  musculature, may reflect underlying myositis. No osseous abnormality. 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 I spoke with [name], Infectious Disease, he recommends continuing flagyl for couple more weeks.  -Repeat blood cultures on 6/29 show no growth to date -Repeat CT scan 7/1:Large rim-enhancing fluid collection in the posterior compartment of the right upper arm and lateral subcutaneous tissues consistent with abscess. Abscess is also seen in the substance of the superficial deltoid posteriorly. No osteomyelitis. -S/p I&D with [name] 7/1, deep wound culture showing MRSA -Continue PO clindamycin to cover MRSA and PO flagyl to cover fusobacterium bacteremia  -Spoke with [name] partner, [name]. Patient to continue wet-to-dry dressing changes on the RUE daily until follow up outpatient.   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 Hgb remains stable   Protein calorie malnutrition -Nutrition consulted, continue supplements   Discharge Instructions  Discharge Instructions    Call MD for:  difficulty breathing, headache or visual disturbances   Complete by:  As directed    Call MD for:  extreme fatigue   Complete by:  As directed    Call MD for:  hives   Complete by:  As directed    Call MD for:  persistant dizziness or light-headedness   Complete by:  As directed    Call MD for:  persistant nausea and vomiting   Complete by:  As directed    Call MD for:  redness, tenderness, or signs of infection (pain,  swelling, redness, odor or green/yellow  discharge around incision site)   Complete by:  As directed    Call MD for:  severe uncontrolled pain   Complete by:  As directed    Call MD for:  temperature >100.4   Complete by:  As directed    Change dressing (specify)   Complete by:  As directed    Continue wet-to-dry dressing changes on the right upper extremity every shift until follow up outpatient.   Diet general   Complete by:  As directed      Allergies as of 11/12/2017   No Known Allergies     Medication List    TAKE these medications   acetaminophen 325 MG tablet Commonly known as:  TYLENOL Take 650 mg by mouth every 4 (four) hours as needed (For pain.). Do not exceed 3 grams in 24 hours.   acetaminophen 500 MG tablet Commonly known as:  TYLENOL Take 1,000 mg by mouth 2 (two) times daily. Do not exceed 3 grams in 24 hours.   bisacodyl 5 MG EC tablet Commonly known as:  DULCOLAX Take 10 mg by mouth daily as needed (for constipation).   citalopram 10 MG tablet Commonly known as:  CELEXA Take 10 mg by mouth daily.   clindamycin 300 MG capsule Commonly known as:  CLEOCIN Take 2 capsules (600 mg total) by mouth every 8 (eight) hours for 7 days.   famotidine 20 MG tablet Commonly known as:  PEPCID Take 20 mg by mouth 2 (two) times daily.   metroNIDAZOLE 500 MG tablet Commonly known as:  FLAGYL Take 1 tablet (500 mg total) by mouth every 8 (eight) hours for 14 days.   oxyCODONE 5 MG immediate release tablet Commonly known as:  Oxy IR/ROXICODONE Take 1 tablet (5 mg total) by mouth every 6 (six) hours as needed for up to 3 days for moderate pain or severe pain.   tetrabenazine 25 MG tablet Commonly known as:  XENAZINE One tablet daily for 2 weeks, then take one tablet twice a day What changed:    how much to take  how to take this  when to take this  additional instructions            Discharge Care Instructions  (From admission, onward)        Start     Ordered   11/12/17 0000  Change  dressing (specify)    Comments:  Continue wet-to-dry dressing changes on the right upper extremity every shift until follow up outpatient.   11/12/17 1610     Follow-up Information    [name], [place]. Schedule an appointment as soon as possible for a visit in 1 week(s).   Specialty:  Internal Medicine Contact information: [address] [place] [address] [phone]        Durene Romans, MD. Schedule an appointment as soon as possible for a visit in 1 week(s).   Specialty:  Orthopedic Surgery Contact information: [address] [place] [address] [phone]          No Known Allergies  Consultations:  Orthopedic surgery [name]    Procedures/Studies: Ct Head Wo Contrast  Result Date: 10/31/2017 CLINICAL DATA:  Found down, head and neck pain. EXAM: CT HEAD WITHOUT CONTRAST CT CERVICAL SPINE WITHOUT CONTRAST TECHNIQUE: Multidetector CT imaging of the head and cervical spine was performed following the standard protocol without intravenous contrast. Multiplanar CT image reconstructions of the cervical spine were also  generated. COMPARISON:  None. FINDINGS: CT HEAD FINDINGS Brain: Study is significantly limited by patient motion artifact, despite multiple imaging attempts. Ventricles appear to be within normal limits in size and configuration. There is no obvious mass, hemorrhage, edema or other evidence of acute parenchymal abnormality. No extra-axial hemorrhage seen. Vascular: There are chronic calcified atherosclerotic changes of the large vessels at the skull base. No unexpected hyperdense vessel. Skull: No obvious fracture displacement. Sinuses/Orbits: No acute finding. Other: None. CT CERVICAL SPINE FINDINGS Cervical spine CT is also significantly limited by patient motion artifact. Alignment: No obvious evidence of acute vertebral body subluxation. Skull base and vertebrae: No obvious fracture line or displaced fracture fragment. Soft  tissues and spinal canal: Unremarkable. Disc levels: No evidence of significant central canal stenosis at any level, although characterization at of the mid and lower levels is significantly limited by the motion artifact. Upper chest: Presumed large emphysematous bleb within the RIGHT lung apex, but difficult to characterize due to the extensive patient motion artifact. Other: None. IMPRESSION: 1. Head CT and cervical spine CT are both significantly limited by patient motion artifact (related to patient's Huntington's disease and associated involuntary movements). 2. No obvious evidence of acute intracranial abnormality. No intracranial hemorrhage seen. No skull fracture seen. 3. No obvious fracture or dislocation within the cervical spine. 4. Presumed large emphysematous bleb within the RIGHT lung apex, but characterization again limited by the patient motion. Electronically Signed   By: Bary Richard M.D.   On: 10/31/2017 19:39   Ct Cervical Spine Wo Contrast  Result Date: 10/31/2017 CLINICAL DATA:  Found down, head and neck pain. EXAM: CT HEAD WITHOUT CONTRAST CT CERVICAL SPINE WITHOUT CONTRAST TECHNIQUE: Multidetector CT imaging of the head and cervical spine was performed following the standard protocol without intravenous contrast. Multiplanar CT image reconstructions of the cervical spine were also generated. COMPARISON:  None. FINDINGS: CT HEAD FINDINGS Brain: Study is significantly limited by patient motion artifact, despite multiple imaging attempts. Ventricles appear to be within normal limits in size and configuration. There is no obvious mass, hemorrhage, edema or other evidence of acute parenchymal abnormality. No extra-axial hemorrhage seen. Vascular: There are chronic calcified atherosclerotic changes of the large vessels at the skull base. No unexpected hyperdense vessel. Skull: No obvious fracture displacement. Sinuses/Orbits: No acute finding. Other: None. CT CERVICAL SPINE FINDINGS Cervical  spine CT is also significantly limited by patient motion artifact. Alignment: No obvious evidence of acute vertebral body subluxation. Skull base and vertebrae: No obvious fracture line or displaced fracture fragment. Soft tissues and spinal canal: Unremarkable. Disc levels: No evidence of significant central canal stenosis at any level, although characterization at of the mid and lower levels is significantly limited by the motion artifact. Upper chest: Presumed large emphysematous bleb within the RIGHT lung apex, but difficult to characterize due to the extensive patient motion artifact. Other: None. IMPRESSION: 1. Head CT and cervical spine CT are both significantly limited by patient motion artifact (related to patient's Huntington's disease and associated involuntary movements). 2. No obvious evidence of acute intracranial abnormality. No intracranial hemorrhage seen. No skull fracture seen. 3. No obvious fracture or dislocation within the cervical spine. 4. Presumed large emphysematous bleb within the RIGHT lung apex, but characterization again limited by the patient motion. Electronically Signed   By: Bary Richard M.D.   On: 10/31/2017 19:39   Ct Extrem Up Entire Arm R W/cm  Addendum Date: 11/07/2017   ADDENDUM REPORT: 11/07/2017 19:51 ADDENDUM: This addendum is given for  the purpose of noting the patient has severe emphysema and a small right pleural effusion. Electronically Signed   By: Drusilla Kanner M.D.   On: 11/07/2017 19:51   Result Date: 11/07/2017 CLINICAL DATA:  Patient admitted 10/31/2017 with right upper extremity pain, swelling and weeping wounds. EXAM: CT OF THE UPPER RIGHT EXTREMITY WITH CONTRAST TECHNIQUE: Multidetector CT imaging of the upper right extremity was performed according to the standard protocol following intravenous contrast administration. COMPARISON:  CT of the right upper extremity 10/31/2017. CONTRAST:  ISOVUE-300 IOPAMIDOL (ISOVUE-300) INJECTION 61% FINDINGS:  Bones/Joint/Cartilage No acute bony or joint abnormality is identified. No bony destructive change or periosteal reaction. Ligaments Suboptimally assessed by CT. Muscles and Tendons There is a large rim enhancing fluid collection along the lateral aspect of the right upper extending into the posterior compartment of the upper arm. The collection tracks throughout the craniocaudal extent of the upper arm measuring approximately 36 cm craniocaudal. In the axial plane, the collection extends throughout the subcutaneous tissues along the lateral aspect of the upper arm across its AP diameter. The collection measures up to approximately 5.2 cm transverse in the posterior aspect of the upper arm by 7 cm AP along the proximal diaphysis of the humerus. At it's most superior point, the collection is within the deltoid muscle. More inferiorly, it extends between the heads of the triceps muscle. A focal intramuscular fluid collection measuring 1.3 cm AP by 1 cm transverse by 1.4 cm craniocaudal is seen in the superficial subcutaneous tissues of the left upper arm medially. Soft tissues The lungs are severely emphysematous. There is a small right pleural effusion. IMPRESSION: Large rim enhancing fluid collection in the posterior compartment of the right upper arm and lateral subcutaneous tissues consistent with abscess. No CT evidence of osteomyelitis is identified. As noted above, the patient's abscess extends between the heads of the triceps muscles worrisome for myositis. Abscess is also seen within the substance of the superficial deltoid posteriorly. Electronically Signed: By: Drusilla Kanner M.D. On: 11/07/2017 19:31   Ct Extrem Up Entire Arm R W/cm  Result Date: 10/31/2017 CLINICAL DATA:  Right upper arm pain, swelling, and redness. History of Huntington's disease. EXAM: CT OF THE UPPER RIGHT EXTREMITY WITH CONTRAST TECHNIQUE: Multidetector CT imaging of the upper right extremity was performed according to the  standard protocol following intravenous contrast administration. COMPARISON:  None. CONTRAST:  75mL OMNIPAQUE IOHEXOL 300 MG/ML  SOLN FINDINGS: Despite efforts by the technologist and patient, motion artifact is present on today's exam and could not be eliminated. This reduces exam sensitivity and specificity. Bones/Joint/Cartilage No acute fracture or dislocation. No cortical destruction or osteolysis. Mild degenerative changes of the glenohumeral joint. Osteopenia. Ligaments Suboptimally assessed by CT. Muscles and Tendons Ill-defined hypodensity within the triceps musculature. Small lipoma within the deltoid muscle. Remaining muscles are grossly unremarkable. The rotator cuff, biceps, triceps, common extensor, common flexor tendons are grossly intact. Soft tissues Moderate circumferential soft tissue swelling about the upper arm. No focal fluid collection. Prominent right axillary lymph nodes are likely reactive. Severe emphysematous change in the right upper lobe with large bullae. No acute abnormality within the visualized abdomen and pelvis. 11 mm cystic skin lesion in the right lower back, likely a sebaceous cyst. IMPRESSION: 1. Moderate circumferential soft tissue swelling about the upper arm, nonspecific. Findings can be seen with cellulitis, venous insufficiency, or third spacing. No abscess. 2. Ill-defined hypodensity throughout the triceps musculature may reflect underlying myositis. 3. No CT evidence of osteomyelitis.  No acute osseous abnormality. 4.  Emphysema (ICD10-J43.9). Electronically Signed   By: Obie Dredge M.D.   On: 10/31/2017 14:46       Discharge Exam: Vitals:   11/11/17 2152 11/12/17 0500  BP: 110/76 109/72  Pulse: 84 82  Resp: 20 16  Temp: 97.7 F (36.5 C) 97.8 F (36.6 C)  SpO2: (!) 86% 100%   General: Pt is alert, awake, not in acute distress Cardiovascular: RRR, S1/S2 +, no rubs, no gallops Respiratory: CTA bilaterally, no wheezing, no rhonchi Abdominal: Soft,  NT, ND, bowel sounds + Extremities: no edema, no cyanosis Neuro: involuntary choreiform movement of the extremities     The results of significant diagnostics from this hospitalization (including imaging, microbiology, ancillary and laboratory) are listed below for reference.     Microbiology: Recent Results (from the past 240 hour(s))  Culture, blood (routine x 2)     Status: None   Collection Time: 11/05/17 11:21 AM  Result Value Ref Range Status   Specimen Description BLOOD LEFT ARM  Final   Special Requests   Final    BOTTLES DRAWN AEROBIC ONLY Blood Culture adequate volume Performed at [facility], [address], [place], [place] [address]    Culture   Final    NO GROWTH 5 DAYS Performed at [facility], [address], [place], [place] [address]    Report Status 11/10/2017 FINAL  Final  Culture, blood (routine x 2)     Status: None   Collection Time: 11/05/17 11:22 AM  Result Value Ref Range Status   Specimen Description BLOOD LEFT HAND  Final   Special Requests   Final    BOTTLES DRAWN AEROBIC ONLY Blood Culture results may not be optimal due to an inadequate volume of blood received in culture bottles Performed at [facility], [address] [place], [place] [address]    Culture   Final    NO GROWTH 5 DAYS Performed at [facility], [address] [place], [place] [address]    Report Status 11/10/2017 FINAL  Final  Aerobic/Anaerobic Culture (surgical/deep wound)     Status: None (Preliminary result)   Collection Time: 11/07/17  1:29 PM  Result Value Ref Range Status   Specimen Description   Final    ABSCESS RIGHT ARM Performed at [facility], [address] [place], [place] [address]    Special Requests   Final    NONE Performed at [facility], [address], [place], [place] [address]    Gram Stain   Final    ABUNDANT WBC PRESENT, PREDOMINANTLY PMN MODERATE GRAM  POSITIVE COCCI Performed at [facility], [address], [place], [place] [address]    Culture   Final    MODERATE METHICILLIN RESISTANT STAPHYLOCOCCUS AUREUS NO ANAEROBES ISOLATED; CULTURE IN PROGRESS FOR 5 DAYS    Report Status PENDING  Incomplete   Organism ID, Bacteria METHICILLIN RESISTANT STAPHYLOCOCCUS AUREUS  Final      Susceptibility   Methicillin resistant staphylococcus aureus - MIC*    CIPROFLOXACIN >=8 RESISTANT Resistant     ERYTHROMYCIN >=8 RESISTANT Resistant     GENTAMICIN <=0.5 SENSITIVE Sensitive     OXACILLIN >=4 RESISTANT Resistant     TETRACYCLINE <=1 SENSITIVE Sensitive     VANCOMYCIN <=0.5 SENSITIVE Sensitive     TRIMETH/SULFA <=10 SENSITIVE Sensitive     CLINDAMYCIN <=0.25 SENSITIVE Sensitive     RIFAMPIN <=0.5 SENSITIVE  Sensitive     Inducible Clindamycin NEGATIVE Sensitive     * MODERATE METHICILLIN RESISTANT STAPHYLOCOCCUS AUREUS  MRSA PCR Screening     Status: Abnormal   Collection Time: 11/08/17 12:16 PM  Result Value Ref Range Status   MRSA by PCR POSITIVE (A) NEGATIVE Final    Comment:        The GeneXpert MRSA Assay (FDA approved for NASAL specimens only), is one component of a comprehensive MRSA colonization surveillance program. It is not intended to diagnose MRSA infection nor to guide or monitor treatment for MRSA infections. RESULT CALLED TO, READ BACK BY AND VERIFIED WITH: Hubbard Robinson [id number] @ 1421 BY J SCOTTON Performed at [facility], [address] [place], [place] [address]   Aerobic/Anaerobic Culture (surgical/deep wound)     Status: None (Preliminary result)   Collection Time: 11/08/17  7:40 PM  Result Value Ref Range Status   Specimen Description   Final    ABSCESS RIGHT SHOULDER Performed at [facility], [address], [place], [place] [address]    Special Requests   Final    NONE Performed at [facility], [address] [place], [place] [address]    Gram  Stain   Final    RARE WBC PRESENT, PREDOMINANTLY PMN RARE GRAM POSITIVE COCCI    Culture   Final    FEW METHICILLIN RESISTANT STAPHYLOCOCCUS AUREUS NO ANAEROBES ISOLATED; CULTURE IN PROGRESS FOR 5 DAYS    Report Status PENDING  Incomplete   Organism ID, Bacteria METHICILLIN RESISTANT STAPHYLOCOCCUS AUREUS  Final      Susceptibility   Methicillin resistant staphylococcus aureus - MIC*    CIPROFLOXACIN >=8 RESISTANT Resistant     ERYTHROMYCIN >=8 RESISTANT Resistant     GENTAMICIN <=0.5 SENSITIVE Sensitive     OXACILLIN >=4 RESISTANT Resistant     TETRACYCLINE <=1 SENSITIVE Sensitive     VANCOMYCIN 1 SENSITIVE Sensitive     TRIMETH/SULFA <=10 SENSITIVE Sensitive     CLINDAMYCIN <=0.25 SENSITIVE Sensitive     RIFAMPIN <=0.5 SENSITIVE Sensitive     Inducible Clindamycin NEGATIVE Sensitive     * FEW METHICILLIN RESISTANT STAPHYLOCOCCUS AUREUS     Labs: BNP (last 3 results) No results for input(s): BNP in the last 8760 hours. Basic Metabolic Panel: Recent Labs  Lab 11/08/17 0713 11/09/17 0822 11/10/17 0408 11/11/17 0426 11/12/17 0418  NA 139 140 140 138 138  K 3.3* 3.5 3.9 3.6 3.9  CL 102 104 104 101 103  CO2 30 29 30 30 28   GLUCOSE 124* 109* 90 109* 131*  BUN 7 10 10 6 8   CREATININE 0.53* 0.51* 0.55* 0.57* 0.65  CALCIUM 7.5* 7.5* 7.6* 7.8* 7.8*   Liver Function Tests: No results for input(s): AST, ALT, ALKPHOS, BILITOT, PROT, ALBUMIN in the last 168 hours. No results for input(s): LIPASE, AMYLASE in the last 168 hours. No results for input(s): AMMONIA in the last 168 hours. CBC: Recent Labs  Lab 11/08/17 0713 11/09/17 0822 11/10/17 0408 11/11/17 0426 11/12/17 0418  WBC 16.0* 12.2* 10.1 12.3* 10.4  HGB 11.2* 9.8* 9.1* 9.7* 9.5*  HCT 34.6* 29.7* 28.1* 29.8* 29.4*  MCV 92.5 92.0 90.6 92.3 92.5  PLT 598* 674* 676* 788* 821*   Cardiac Enzymes: No results for input(s): CKTOTAL, CKMB, CKMBINDEX, TROPONINI in the last 168 hours. BNP: Invalid input(s):  POCBNP CBG: No results for input(s): GLUCAP in the last 168 hours. D-Dimer No results for input(s): DDIMER in the last  72 hours. Hgb A1c No results for input(s): HGBA1C in the last 72 hours. Lipid Profile No results for input(s): CHOL, HDL, LDLCALC, TRIG, CHOLHDL, LDLDIRECT in the last 72 hours. Thyroid function studies No results for input(s): TSH, T4TOTAL, T3FREE, THYROIDAB in the last 72 hours.  Invalid input(s): FREET3 Anemia work up No results for input(s): VITAMINB12, FOLATE, FERRITIN, TIBC, IRON, RETICCTPCT in the last 72 hours. Urinalysis    Component Value Date/Time   COLORURINE AMBER (A) 10/31/2017 1529   APPEARANCEUR CLOUDY (A) 10/31/2017 1529   LABSPEC 1.027 10/31/2017 1529   PHURINE 5.0 10/31/2017 1529   GLUCOSEU 50 (A) 10/31/2017 1529   HGBUR LARGE (A) 10/31/2017 1529   BILIRUBINUR NEGATIVE 10/31/2017 1529   KETONESUR NEGATIVE 10/31/2017 1529   PROTEINUR 100 (A) 10/31/2017 1529   NITRITE NEGATIVE 10/31/2017 1529   LEUKOCYTESUR NEGATIVE 10/31/2017 1529   Sepsis Labs Invalid input(s): PROCALCITONIN,  WBC,  LACTICIDVEN Microbiology Recent Results (from the past 240 hour(s))  Culture, blood (routine x 2)     Status: None   Collection Time: 11/05/17 11:21 AM  Result Value Ref Range Status   Specimen Description BLOOD LEFT ARM  Final   Special Requests   Final    BOTTLES DRAWN AEROBIC ONLY Blood Culture adequate volume Performed at [facility], [address], [place], [place] [address]    Culture   Final    NO GROWTH 5 DAYS Performed at [facility], [address] [place], [place] [address]    Report Status 11/10/2017 FINAL  Final  Culture, blood (routine x 2)     Status: None   Collection Time: 11/05/17 11:22 AM  Result Value Ref Range Status   Specimen Description BLOOD LEFT HAND  Final   Special Requests   Final    BOTTLES DRAWN AEROBIC ONLY Blood Culture results may not be optimal due to an inadequate volume of blood  received in culture bottles Performed at [facility], [address] [place], [place] [address]    Culture   Final    NO GROWTH 5 DAYS Performed at [facility], [address] [place], [place] [address]    Report Status 11/10/2017 FINAL  Final  Aerobic/Anaerobic Culture (surgical/deep wound)     Status: None (Preliminary result)   Collection Time: 11/07/17  1:29 PM  Result Value Ref Range Status   Specimen Description   Final    ABSCESS RIGHT ARM Performed at [facility], [address] [place], [place] [address]    Special Requests   Final    NONE Performed at [facility], [address], [place], [place] [address]    Gram Stain   Final    ABUNDANT WBC PRESENT, PREDOMINANTLY PMN MODERATE GRAM POSITIVE COCCI Performed at [facility], [address] [place], [place] [address]    Culture   Final    MODERATE METHICILLIN RESISTANT STAPHYLOCOCCUS AUREUS NO ANAEROBES ISOLATED; CULTURE IN PROGRESS FOR 5 DAYS    Report Status PENDING  Incomplete   Organism ID, Bacteria METHICILLIN RESISTANT STAPHYLOCOCCUS AUREUS  Final      Susceptibility   Methicillin resistant staphylococcus aureus - MIC*    CIPROFLOXACIN >=8 RESISTANT Resistant     ERYTHROMYCIN >=8 RESISTANT Resistant     GENTAMICIN <=0.5 SENSITIVE Sensitive     OXACILLIN >=4 RESISTANT Resistant     TETRACYCLINE <=1 SENSITIVE Sensitive     VANCOMYCIN <=0.5 SENSITIVE Sensitive  TRIMETH/SULFA <=10 SENSITIVE Sensitive     CLINDAMYCIN <=0.25 SENSITIVE Sensitive     RIFAMPIN <=0.5 SENSITIVE Sensitive     Inducible Clindamycin NEGATIVE Sensitive     * MODERATE METHICILLIN RESISTANT STAPHYLOCOCCUS AUREUS  MRSA PCR Screening     Status: Abnormal   Collection Time: 11/08/17 12:16 PM  Result Value Ref Range Status   MRSA by PCR POSITIVE (A) NEGATIVE Final    Comment:        The GeneXpert MRSA Assay (FDA approved for NASAL specimens only), is one  component of a comprehensive MRSA colonization surveillance program. It is not intended to diagnose MRSA infection nor to guide or monitor treatment for MRSA infections. RESULT CALLED TO, READ BACK BY AND VERIFIED WITH: Hubbard RobinsonFRED HERNDON,RN [id number] @ 1421 BY J SCOTTON Performed at [facility], [address] [place], [place] [address]   Aerobic/Anaerobic Culture (surgical/deep wound)     Status: None (Preliminary result)   Collection Time: 11/08/17  7:40 PM  Result Value Ref Range Status   Specimen Description   Final    ABSCESS RIGHT SHOULDER Performed at [facility], [address] [place], [place] [address]    Special Requests   Final    NONE Performed at [facility], [address] [place], [place] [address]    Gram Stain   Final    RARE WBC PRESENT, PREDOMINANTLY PMN RARE GRAM POSITIVE COCCI    Culture   Final    FEW METHICILLIN RESISTANT STAPHYLOCOCCUS AUREUS NO ANAEROBES ISOLATED; CULTURE IN PROGRESS FOR 5 DAYS    Report Status PENDING  Incomplete   Organism ID, Bacteria METHICILLIN RESISTANT STAPHYLOCOCCUS AUREUS  Final      Susceptibility   Methicillin resistant staphylococcus aureus - MIC*    CIPROFLOXACIN >=8 RESISTANT Resistant     ERYTHROMYCIN >=8 RESISTANT Resistant     GENTAMICIN <=0.5 SENSITIVE Sensitive     OXACILLIN >=4 RESISTANT Resistant     TETRACYCLINE <=1 SENSITIVE Sensitive     VANCOMYCIN 1 SENSITIVE Sensitive     TRIMETH/SULFA <=10 SENSITIVE Sensitive     CLINDAMYCIN <=0.25 SENSITIVE Sensitive     RIFAMPIN <=0.5 SENSITIVE Sensitive     Inducible Clindamycin NEGATIVE Sensitive     * FEW METHICILLIN RESISTANT STAPHYLOCOCCUS AUREUS     Patient was seen and examined on the day of discharge and was found to be in stable condition. Time coordinating discharge: 35 minutes including assessment and coordination of care, as well as examination of the patient.   SIGNED:  Noralee StainJennifer Johnpaul Gillentine, DO [facility] Pager [phone]  If 7PM-7AM, please contact night-coverage [email or web address] Password [email or web address] 11/12/2017, 9:42 AM

## 2017-11-12 NOTE — [place] FL2 (Addendum)
[place] MEDICAID FL2 LEVEL OF CARE SCREENING TOOL     IDENTIFICATION  Patient Name: Samuel Lambert Birthdate: 02/12/1956 Sex: male Admission Date (Current Location): 10/31/2017  [facility] [place]   Facility and Address:  [facility],  [address], [place] [address]      Provider Number: [id number]  Attending Physician Name and Address:  Noralee Stainhoi, Jennifer, DO  Relative Name and Phone Number:  Mariel KanskyMartha Clark [facility]([place] DSS) - [phone]    Current Level of Care: Hospital Recommended Level of Care: Assisted living facility Prior Approval Number:    Date Approved/Denied:   PASRR Number:    Discharge Plan: Other (assisted living facility)   Current Diagnoses: Patient Active Problem List   Diagnosis Date Noted  . Sepsis (HCC) 10/31/2017  . Choreoathetosis 09/21/2017    Orientation RESPIRATION BLADDER Height & Weight     Self, Place  Normal Incontinent Weight: 160 lb (72.6 kg) Height:  6' (182.9 cm)  BEHAVIORAL SYMPTOMS/MOOD NEUROLOGICAL BOWEL NUTRITION STATUS      Incontinent Diet(Regular)  AMBULATORY STATUS COMMUNICATION OF NEEDS Skin   extensive assist Verbally Other (Comment), Skin abrasions(Abrasion right/left knee and leg; Cellulitis right shoulder and arm; MASD right/left butttocks treated with barrier cream; Rash right back; Weeping of right arm)   He needs dressing changes every shift or at least once daily.                         Personal Care Assistance Level of Assistance  Bathing, Feeding, Dressing Bathing Assistance: Limited assistance Feeding assistance: Independent Dressing Assistance: Limited assistance     Functional Limitations Info  Sight, Hearing, Speech Sight Info: Adequate Hearing Info: Adequate Speech Info: Adequate (difficult to understand due to neurological condition)    SPECIAL CARE FACTORS FREQUENCY                       Contractures Contractures Info: Not present     Additional Factors Info  Code Status, Allergies Code Status Info: Full Allergies Info: No known allergies           Current Medications (11/12/2017):  This is the current hospital active medication list Current Facility-Administered Medications  Medication Dose Route Frequency Provider Last Rate Last Dose  . acetaminophen (TYLENOL) tablet 650 mg  650 mg Oral Q6H PRN Durene Romanslin, Matthew, MD   650 mg at 11/01/17 1425   Or  . acetaminophen (TYLENOL) solution 650 mg  650 mg Oral Q6H PRN Durene Romanslin, Matthew, MD   650 mg at 10/31/17 2132  . bisacodyl (DULCOLAX) EC tablet 10 mg  10 mg Oral Daily PRN Durene Romanslin, Matthew, MD      . citalopram (CELEXA) tablet 10 mg  10 mg Oral Daily Durene Romanslin, Matthew, MD   10 mg at 11/12/17 1043  . clindamycin (CLEOCIN) capsule 600 mg  600 mg Oral Q8H Noralee Stainhoi, Jennifer, DO   600 mg at 11/12/17 1357  . docusate sodium (COLACE) capsule 100 mg  100 mg Oral BID Durene Romanslin, Matthew, MD   100 mg at 11/12/17 1043  . enoxaparin (LOVENOX) injection 40 mg  40 mg Subcutaneous Q24H Durene Romanslin, Matthew, MD   40 mg at 11/12/17 1043  . feeding supplement (ENSURE ENLIVE) (ENSURE ENLIVE) liquid 237 mL  237 mL Oral TID BM Durene Romanslin, Matthew, MD   237 mL at 11/11/17 2037  . haloperidol lactate (HALDOL) injection 1 mg  1 mg Intravenous Q6H PRN Durene Romanslin, Matthew, MD  1 mg at 11/09/17 1848   Or  . haloperidol (HALDOL) 2 MG/ML solution 2 mg  2 mg Oral Q6H PRN Durene Romans, MD   2 mg at 11/11/17 0981  . metoCLOPramide (REGLAN) tablet 5-10 mg  5-10 mg Oral Q8H PRN Durene Romans, MD       Or  . metoCLOPramide (REGLAN) injection 5-10 mg  5-10 mg Intravenous Q8H PRN Durene Romans, MD      . metroNIDAZOLE (FLAGYL) tablet 500 mg  500 mg Oral Q8H Noralee Stain, DO   500 mg at 11/12/17 1357  . multivitamin with minerals tablet 1 tablet  1 tablet Oral Daily Durene Romans, MD   1 tablet at 11/12/17 1043  . ondansetron (ZOFRAN) tablet 4 mg  4 mg Oral Q6H PRN Durene Romans, MD       Or  . ondansetron [facility]) injection 4 mg  4 mg  Intravenous Q6H PRN Durene Romans, MD      . oxyCODONE (Oxy IR/ROXICODONE) immediate release tablet 5 mg  5 mg Oral Q4H PRN Durene Romans, MD   5 mg at 11/12/17 1043  . pantoprazole (PROTONIX) EC tablet 40 mg  40 mg Oral Daily Durene Romans, MD   40 mg at 11/12/17 1043  . tetrabenazine Guinevere Scarlet) tablet 25 mg  25 mg Oral BID Durene Romans, MD   25 mg at 11/12/17 1357     Discharge Medications: Please see discharge summary for a list of discharge medications.  Relevant Imaging Results:  Relevant Lab Results:   Additional Information DISCHARGE MEDICATIONS:  TAKE these medications          acetaminophen 325 MG tablet Commonly known as:  TYLENOL Take 650 mg by mouth every 4 (four) hours as needed (For pain.). Do not exceed 3 grams in 24 hours.   acetaminophen 500 MG tablet Commonly known as:  TYLENOL Take 1,000 mg by mouth 2 (two) times daily. Do not exceed 3 grams in 24 hours.   bisacodyl 5 MG EC tablet Commonly known as:  DULCOLAX Take 10 mg by mouth daily as needed (for constipation).   citalopram 10 MG tablet Commonly known as:  CELEXA Take 10 mg by mouth daily.   clindamycin 300 MG capsule Commonly known as:  CLEOCIN Take 2 capsules (600 mg total) by mouth every 8 (eight) hours for 7 days.   famotidine 20 MG tablet Commonly known as:  PEPCID Take 20 mg by mouth 2 (two) times daily.   metroNIDAZOLE 500 MG tablet Commonly known as:  FLAGYL Take 1 tablet (500 mg total) by mouth every 8 (eight) hours for 14 days.   oxyCODONE 5 MG immediate release tablet Commonly known as:  Oxy IR/ROXICODONE Take 1 tablet (5 mg total) by mouth every 6 (six) hours as needed for up to 3 days for moderate pain or severe pain.   tetrabenazine 25 MG tablet Commonly known as:  XENAZINE One tablet daily for 2 weeks, then take one tablet twice a day What changed:    how much to take  how to take this  when to take this  additional instructions     Okey Dupre Lazaro Arms, LCSW

## 2017-11-12 NOTE — Progress Notes (Addendum)
Contacted [facility] rep. States she will [facility] for daily dressing changes. If they can accept referral, start of care will be Monday or Tuesday. Isidoro DonningAlesia Keisuke Hollabaugh RN CCM Case Mgmt phone [phone]  11/13/2017 11 am Re[facility], Alvino Chapelllen. HH RN is not covered by insurance for daily wound care. Isidoro DonningAlesia Bayleigh Loflin RN CCM Case Mgmt phone [phone]

## 2017-11-13 LAB — BASIC METABOLIC PANEL
ANION GAP: 7 (ref 5–15)
BUN: 8 mg/dL (ref 6–20)
CHLORIDE: 105 mmol/L (ref 98–111)
CO2: 27 mmol/L (ref 22–32)
Calcium: 7.7 mg/dL — ABNORMAL LOW (ref 8.9–10.3)
Creatinine, Ser: 0.58 mg/dL — ABNORMAL LOW (ref 0.61–1.24)
GFR calc non Af Amer: >60 mL/min (ref >60)
GLUCOSE: 102 mg/dL — AB (ref 70–99)
POTASSIUM: 4.1 mmol/L (ref 3.5–5.1)
Sodium: 139 mmol/L (ref 135–145)

## 2017-11-13 LAB — AEROBIC/ANAEROBIC CULTURE W GRAM STAIN (SURGICAL/DEEP WOUND)

## 2017-11-13 LAB — CBC
HCT: 28.4 % — ABNORMAL LOW (ref 39.0–52.0)
Hemoglobin: 9.2 g/dL — ABNORMAL LOW (ref 13.0–17.0)
MCH: 30 pg (ref 26.0–34.0)
MCHC: 32.4 g/dL (ref 30.0–36.0)
MCV: 92.5 fL (ref 78.0–100.0)
Platelets: 802 K/uL — ABNORMAL HIGH (ref 150–400)
RBC: 3.07 MIL/uL — ABNORMAL LOW (ref 4.22–5.81)
RDW: 14.9 % (ref 11.5–15.5)
WBC: 8.8 K/uL (ref 4.0–10.5)

## 2017-11-13 LAB — AEROBIC/ANAEROBIC CULTURE (SURGICAL/DEEP WOUND)

## 2017-11-13 NOTE — Progress Notes (Signed)
  PROGRESS NOTE  Patient seen and examined. Overall unchanged. WBC improving. RUE dressing is clean and dry. He was supposed to discharge to ALF yesterday but unable to take him due to his dressing change requirement. He needs dressing changes every shift or at least once daily. If ALF is unable to provide this care, he will need SNF. Discussed with SW this morning.    Noralee StainJennifer Sudeep Scheibel, DO [facility] [email or web address] Password [email or web address] 11/13/2017, 9:04 AM

## 2017-11-13 NOTE — Progress Notes (Signed)
CSW contacted by MD that patient is medically stable for discharge, but might need SNF placement. Per chart review, patient's guardian is through DSS; will not be available to discuss potential SNF placement via phone until Monday, as DSS offices are closed over the weekend. Per RNCM note from yesterday, if patient would be able to [facility], [facility] will not be confirmed until Monday, either.   Patient unable to discharge due to barriers described above. CSW to continue to follow and work on addressing barriers on Monday.  Blenda NicelyElizabeth Georganne Siple, LCSW Clinical Social Worker [phone] (Weekend Coverage)

## 2017-11-14 NOTE — Progress Notes (Addendum)
  PROGRESS NOTE  No change in patient's clinical status. Will need SNF for daily wound care and dressing changes. Medically ready for discharge once placement available. Discharge summary has been updated today to reflect discharge date and daily dressing change recommendation.    Noralee StainJennifer Brooksie Ellwanger, DO [facility] [email or web address] Password [email or web address] 11/14/2017, 8:54 AM

## 2017-11-14 NOTE — Progress Notes (Signed)
Patient ID: Samuel Lambert, male   DOB: 05/05/1956, 62 y.o.   MRN: [id number] Subjective: 6 Days Post-Op Procedure(s) (LRB): INCISION AND DRAINAGE ABSCESS (Right)    Patient gives thumbs up when asked how doing today.  No events reported other than significant drainage post op that has since slowed considerably  Objective:   VITALS:   Vitals:   11/13/17 2118 11/14/17 0539  BP: 121/72 95/63  Pulse: 71 68  Resp: 15 15  Temp: 97.8 F (36.6 C) 97.7 F (36.5 C)  SpO2: 100% 97%    Neurovascular intact Incision: scant drainage - mild saturation of right arm dressing, no active drainage Right arm swelling and erythema significantly improved since I&D Incision line healing  WBC significantly improving  LABS Recent Labs    11/12/17 0418 11/13/17 0430  HGB 9.5* 9.2*  HCT 29.4* 28.4*  WBC 10.4 8.8  PLT 821* 802*    Recent Labs    11/12/17 0418 11/13/17 0430  NA 138 139  K 3.9 4.1  BUN 8 8  CREATININE 0.65 0.58*  GLUCOSE 131* 102*    No results for input(s): LABPT, INR in the last 72 hours.   Assessment/Plan: 6 Days Post-Op Procedure(s) (LRB): INCISION AND DRAINAGE ABSCESS (Right)  Continue dressing changes once or twice a day as needed for drainage PO antibiotics until healed RTC in 2 weeks for wound check and suture removal

## 2017-11-14 NOTE — Progress Notes (Addendum)
CSW following to assist with DC plan- [facility]- ALF representatives report they are unable to provide wound care (daily dressing changes). SNF needing to be pursued. Per ALF, pt has legal guardian with [place] [employer] [name] [phone]. CSW left voicemail requesting returned call to discuss planning. Need guardian's consent to admit to SNF. Made referrals to area SNFs  Could not locate pt in [place] Must system therefore unable to submit for PASRR. Awaiting [employer] Must assistance.  Samuel Lambert, MSW, LCSW Clinical Social Work 11/14/2017 [phone]  ALF administrator now advising facility may be able to accommodate pt's wound care needs which would make SNF unnecessary.  Awaiting call back with decision from ALF. Received voicemail from [place] [employer], states that pt no longer has open case with DSS, that he does not have a DSS guardian. ALF maintains that pt does have DSS guardian and states M. Chestine SporeClark is their contact there. Unable to investigate further as M. Clark states pt is not a ward of DSS.

## 2017-11-14 NOTE — Care Management Important Message (Signed)
Important Message  Patient Details IM Letter given to Nora/Case Manager to present to the Patient Name: Samuel Lambert Shurtz MRN: [id number] Date of Birth: 01/09/1956   Medicare Important Message Given:  Yes    Caren MacadamFuller, Lahna Nath 11/14/2017, 1:25 PMImportant Message  Patient Details  Name: Samuel Lambert Bilotti MRN: [id number] Date of Birth: 06/30/1955   Medicare Important Message Given:  Yes    Caren MacadamFuller, Latise Dilley 11/14/2017, 1:25 PM

## 2017-11-15 ENCOUNTER — Emergency Department (HOSPITAL_COMMUNITY)
Admission: EM | Admit: 2017-11-15 | Discharge: 2017-11-15 | Disposition: A | Discharge status: Home / Self Care | Payer: Medicare Other | Attending: Emergency Medicine | Admitting: Emergency Medicine

## 2017-11-15 ENCOUNTER — Other Ambulatory Visit: Payer: Self-pay

## 2017-11-15 ENCOUNTER — Encounter (HOSPITAL_COMMUNITY): Payer: Self-pay

## 2017-11-15 DIAGNOSIS — T8130XA Disruption of wound, unspecified, initial encounter: Secondary | ICD-10-CM

## 2017-11-15 DIAGNOSIS — Y658 Other specified misadventures during surgical and medical care: Secondary | ICD-10-CM | POA: Insufficient documentation

## 2017-11-15 DIAGNOSIS — T8131XA Disruption of external operation (surgical) wound, not elsewhere classified, initial encounter: Secondary | ICD-10-CM | POA: Diagnosis not present

## 2017-11-15 DIAGNOSIS — W19XXXA Unspecified fall, initial encounter: Secondary | ICD-10-CM

## 2017-11-15 DIAGNOSIS — F209 Schizophrenia, unspecified: Secondary | ICD-10-CM | POA: Insufficient documentation

## 2017-11-15 DIAGNOSIS — R4182 Altered mental status, unspecified: Secondary | ICD-10-CM | POA: Insufficient documentation

## 2017-11-15 LAB — CREATININE, SERUM: Creatinine, Ser: 0.68 mg/dL (ref 0.61–1.24)

## 2017-11-15 MED ORDER — ACETAMINOPHEN 325 MG PO TABS
650.0000 mg | ORAL_TABLET | Freq: Once | ORAL | Status: AC
  Administered 2017-11-15: 650 mg via ORAL
  Filled 2017-11-15: qty 2

## 2017-11-15 NOTE — Discharge Instructions (Addendum)
Use Tylenol if needed for pain.  For the wound on the right upper arm, continue saline wet-to-dry dressings, until healed.  The wound has opened up in the middle, but there is no sign of infection at this time that would require antibiotic treatments or additional surgery.  He sees the surgeon, Dr. Charlann Boxerlin, next week as scheduled, for a recheck.

## 2017-11-15 NOTE — ED Notes (Signed)
Cleaned patients wound on bottom and replaced with bandage. Replaced bandage on right arm.

## 2017-11-15 NOTE — Progress Notes (Signed)
  PROGRESS NOTE  No change in patient's clinical status. Remains stable for discharge to ALF as long as they can provide daily dressing changes. Discharge summary updated today.   Noralee StainJennifer Kylieann Eagles, DO [facility] [email or web address] Password [email or web address] 11/15/2017, 9:25 AM

## 2017-11-15 NOTE — ED Notes (Signed)
Bed: [id number] Expected date: 11/15/17 Expected time: 2:14 PM Means of arrival: Ambulance Comments: Fall from [facility] home, small lac over eye, No LOC

## 2017-11-15 NOTE — Progress Notes (Signed)
Spoke with Roxanna at [facility]- states she was informed pt approved to return to ALF. DC information was provided.  [facility] transport arranged. No contacts to notify.  Ilean SkillMeghan Cabe Lashley, MSW, LCSW Clinical Social Work 11/15/2017 [phone]

## 2017-11-15 NOTE — ED Notes (Signed)
Patient unable to sign signature pad.  

## 2017-11-15 NOTE — ED Provider Notes (Signed)
[facility]-EMERGENCY DEPT Provider Note   CSN: [id number] Arrival date & time: 11/15/17  1414     History   Chief Complaint Chief Complaint  Patient presents with  . Fall    HPI Samuel Lambert is a 62 y.o. male.  HPI   Patient with Huntington's chorea and frequent falls presenting for evaluation of fall from chair with resulting in generalized pain.  When asked patient states his pain is "everywhere."  Patient is difficult to communicate with because of difficulty with speech, making it generally understandable, with the exception of few words that he can clearly speak.  He is cooperative, follows commands well, to assist with examination.  Patient is unable to give recent history.  Level 5 caveat-altered mental status  Past Medical History:  Diagnosis Date  . Depression   . Huntington's disease [facility])     Patient Active Problem List   Diagnosis Date Noted  . Sepsis (HCC) 10/31/2017  . Choreoathetosis 09/21/2017    Past Surgical History:  Procedure Laterality Date  . copd    . INCISION AND DRAINAGE ABSCESS Right 11/08/2017   Procedure: INCISION AND DRAINAGE ABSCESS;  Surgeon: Durene Romans, MD;  Location: [facility];  Service: Orthopedics;  Laterality: Right;  . schizophrenia    . unspecified abnormal involuntary movements          Home Medications    Prior to Admission medications   Medication Sig Start Date End Date Taking? Authorizing Provider  acetaminophen (TYLENOL) 325 MG tablet Take 650 mg by mouth every 4 (four) hours as needed (For pain.). Do not exceed 3 grams in 24 hours.    [provider]  acetaminophen (TYLENOL) 500 MG tablet Take 1,000 mg by mouth 2 (two) times daily. Do not exceed 3 grams in 24 hours.    [provider]  bisacodyl (DULCOLAX) 5 MG EC tablet Take 10 mg by mouth daily as needed (for constipation).    [provider]  citalopram (CELEXA) 10 MG tablet Take 10 mg by mouth daily. 09/19/17    [provider]  clindamycin (CLEOCIN) 300 MG capsule Take 2 capsules (600 mg total) by mouth every 8 (eight) hours for 7 days. 11/11/17 11/18/17  Noralee Stain, DO  famotidine (PEPCID) 20 MG tablet Take 20 mg by mouth 2 (two) times daily.     [provider]  metroNIDAZOLE (FLAGYL) 500 MG tablet Take 1 tablet (500 mg total) by mouth every 8 (eight) hours for 14 days. 11/11/17 11/25/17  Noralee Stain, DO  tetrabenazine Guinevere Scarlet) 25 MG tablet One tablet daily for 2 weeks, then take one tablet twice a day Patient taking differently: Take 25 mg by mouth 2 (two) times daily.  09/21/17   York Spaniel, MD    Family History History reviewed. No pertinent family history.  Social History Social History   Tobacco Use  . Smoking status: Never Smoker  . Smokeless tobacco: Never Used  Substance Use Topics  . Alcohol use: Never    Frequency: Never  . Drug use: Never     Allergies   Patient has no known allergies.   Review of Systems Review of Systems  Unable to perform ROS: Mental status change     Physical Exam Updated Vital Signs BP 106/70   Pulse (!) 112   Temp 98.9 F (37.2 C) (Oral)   Resp 18   SpO2 97%   Physical Exam  Constitutional: He is oriented to person, place, and time. He appears  well-developed. No distress.  Very frail.  Appears older than stated age.  HENT:  Head: Normocephalic and atraumatic.  Right Ear: External ear normal.  Left Ear: External ear normal.  Eyes: Pupils are equal, round, and reactive to light. Conjunctivae and EOM are normal.  Neck: Normal range of motion and phonation normal. Neck supple.  Cardiovascular: Normal rate, regular rhythm and normal heart sounds.  Pulmonary/Chest: Effort normal and breath sounds normal. He exhibits no bony tenderness.  Abdominal: Soft. There is no tenderness.  Musculoskeletal:  Moves arms and legs symmetrically, with chorea motions.  Right upper lateral arm with a sutured wound approximately 8  cm, with intact sutures in it.  The center portion of the wound is dehisced with a white-colored material beneath the wound, visible through the gaping wound edges.  This material is firm and does not drain with light pressure.  Patient had a clean bandage on his right upper arm wound, that had a small amount of serosanguineous drainage, from the central area of the wound.  See representative photo.  Neurological: He is alert and oriented to person, place, and time. No cranial nerve deficit or sensory deficit. He exhibits normal muscle tone. Coordination normal.  Skin: Skin is warm, dry and intact.  Psychiatric: He has a normal mood and affect. His behavior is normal. Judgment and thought content normal.  Nursing note and vitals reviewed.       ED Treatments / Results  Labs (all labs ordered are listed, but only abnormal results are displayed) Labs Reviewed - No data to display  EKG None  Radiology No results found.  Procedures Procedures (including critical care time)  Medications Ordered in ED Medications  acetaminophen (TYLENOL) tablet 650 mg (650 mg Oral Given 11/15/17 1523)     Initial Impression / Assessment and Plan / ED Course  I have reviewed the triage vital signs and the nursing notes.  Pertinent labs & imaging results that were available during my care of the patient were reviewed by me and considered in my medical decision making (see chart for details).  Clinical Course as of Nov 16 1623  Tue Nov 15, 2017  1621 I was able to reach Dr. Charlann Boxerlin, his surgeon who operated on his right upper arm infection, 1 week ago.  He reports draining 300 cc of pus from the wound.  He believes that the appearance of white material in the wound which is partially dehisced, is muscle fascia from his upper arm muscle bundle.  He recommends continuing wet-to-dry dressings and follow-up in the office for reevaluation as planned in 1 week.   [EW]    Clinical Course User Index [EW] Mancel BaleWentz,  Bryan Omura, MD     Patient Vitals for the past 24 hrs:  BP Temp Temp src Pulse Resp SpO2  11/15/17 1600 106/70 - - - - -  11/15/17 1550 116/75 - - (!) 112 18 97 %  11/15/17 1502 - - - (!) 101 16 95 %  11/15/17 1438 128/85 98.9 F (37.2 C) Oral (!) 101 20 95 %  11/15/17 1437 128/85 - - (!) 102 19 95 %  11/15/17 1427 - - - - - 95 %  11/15/17 1425 - - - - - 95 %    4:22 PM Reevaluation with update and discussion. After initial assessment and treatment, an updated evaluation reveals no change in clinical status.  Patient appears comfortable at rest. Mancel BaleElliott Marqui Formby   Medical Decision Making: Fall without apparent injury.  Patient falls  frequently.  No indication for imaging at this time.  Patient with right upper arm wound status post I&D, 7 days ago.  Wound is not frankly infected but has dehisced in the center portion, and has material that appears to be blocking healing.  This may require additional debridement, therefore his surgeon was paged.  Vital signs are reassuring.  Doubt systemic infection or metabolic instability.  CRITICAL CARE-no Performed by: Mancel Bale   Nursing Notes Reviewed/ Care Coordinated Applicable Imaging Reviewed Interpretation of Laboratory Data incorporated into ED treatment  The patient appears reasonably screened and/or stabilized for discharge and I doubt any other medical condition or other [facility] requiring further screening, evaluation, or treatment in the ED at this time prior to discharge.  Plan: Home Medications-continue usual medications; Home Treatments-wound care, for right arm wound; return here if the recommended treatment, does not improve the symptoms; Recommended follow up-PCP, as needed.  Orthopedic follow-up 1 week as scheduled for reevaluation of the right arm wound      Final Clinical Impressions(s) / ED Diagnoses   Final diagnoses:  Fall, initial encounter  Wound dehiscence    ED Discharge Orders    None       Mancel Bale,  MD 11/15/17 1625

## 2017-11-15 NOTE — ED Notes (Addendum)
[facility] called. Report given to RN at [facility].

## 2017-11-15 NOTE — ED Triage Notes (Signed)
[facility] arrived via [facility] from [facility]. Patient had a witnessed fall from chair. No LOC. No blood thinners. Patient acting his normal per baseline. Patient c/o hurting everywhere. Small abrasion over left eye. bp-140/90, hr-110, 95% ra

## 2017-11-17 ENCOUNTER — Encounter (HOSPITAL_COMMUNITY): Payer: Self-pay | Admitting: Emergency Medicine

## 2017-11-17 ENCOUNTER — Emergency Department (HOSPITAL_COMMUNITY)
Admission: EM | Admit: 2017-11-17 | Discharge: 2017-11-17 | Disposition: A | Discharge status: Skilled Nursing Facility | Payer: Medicare Other | Attending: Emergency Medicine | Admitting: Emergency Medicine

## 2017-11-17 ENCOUNTER — Other Ambulatory Visit: Payer: Self-pay

## 2017-11-17 DIAGNOSIS — Z79899 Other long term (current) drug therapy: Secondary | ICD-10-CM | POA: Diagnosis not present

## 2017-11-17 DIAGNOSIS — R21 Rash and other nonspecific skin eruption: Secondary | ICD-10-CM

## 2017-11-17 MED ORDER — HYDROCORTISONE 2.5 % EX LOTN: TOPICAL_LOTION | CUTANEOUS | 0 refills | Status: AC

## 2017-11-17 NOTE — ED Notes (Signed)
Seizure pads placed on bed rails for patient safety with involuntarily movements

## 2017-11-17 NOTE — Discharge Instructions (Addendum)
Clean buttocks twice a day gently with soap and water and then apply the hydrocortisone lotion.  Make sure patient has a clean diaper after each application of lotion

## 2017-11-17 NOTE — ED Notes (Signed)
Called [facility] to give report, had to leave a message for RN to call back.

## 2017-11-17 NOTE — ED Provider Notes (Signed)
[facility]-EMERGENCY DEPT Provider Note   CSN: [id number] Arrival date & time: 11/17/17  [id number]     History   Chief Complaint Chief Complaint  Patient presents with  . bed sores    HPI Samuel Lambert is a 62 y.o. male.  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  Patient wears a diaper.  He was sent over here for rash on his buttocks which supposedly had some bleeding.  The history is provided by the nursing home. No language interpreter was used.  Rash   This is a recurrent problem. The current episode started more than 2 days ago. The problem has not changed since onset.The problem is associated with nothing. There has been no fever. Affected Location: Buttocks. The pain is mild. The pain has been constant since onset. Pertinent negatives include no weeping. He has tried nothing for the symptoms.    Past Medical History:  Diagnosis Date  . Depression   . Huntington's disease [facility](HCC)     Patient Active Problem List   Diagnosis Date Noted  . Sepsis (HCC) 10/31/2017  . Choreoathetosis 09/21/2017    Past Surgical History:  Procedure Laterality Date  . copd    . INCISION AND DRAINAGE ABSCESS Right 11/08/2017   Procedure: INCISION AND DRAINAGE ABSCESS;  Surgeon: Durene Romanslin, Matthew, MD;  Location: [facility];  Service: Orthopedics;  Laterality: Right;  . schizophrenia    . unspecified abnormal involuntary movements          Home Medications    Prior to Admission medications   Medication Sig Start Date End Date Taking? Authorizing Provider  acetaminophen (TYLENOL) 325 MG tablet Take 650 mg by mouth every 4 (four) hours as needed (For pain.). Do not exceed 3 grams in 24 hours.    [provider]  acetaminophen (TYLENOL) 500 MG tablet Take 1,000 mg by mouth 2 (two) times daily. Do not exceed 3 grams in 24 hours.    [provider]  bisacodyl (DULCOLAX) 5 MG EC tablet Take 10 mg by mouth daily as needed (for constipation).     [provider]  citalopram (CELEXA) 10 MG tablet Take 10 mg by mouth daily. 09/19/17   [provider]  clindamycin (CLEOCIN) 300 MG capsule Take 2 capsules (600 mg total) by mouth every 8 (eight) hours for 7 days. 11/11/17 11/18/17  Noralee Stainhoi, Jennifer, DO  famotidine (PEPCID) 20 MG tablet Take 20 mg by mouth 2 (two) times daily.     [provider]  hydrocortisone 2.5 % lotion Apply to buttocks twice a day 11/17/17   Bethann BerkshireZammit, Shantara Goosby, MD  metroNIDAZOLE (FLAGYL) 500 MG tablet Take 1 tablet (500 mg total) by mouth every 8 (eight) hours for 14 days. 11/11/17 11/25/17  Noralee Stainhoi, Jennifer, DO  tetrabenazine Guinevere Scarlet(XENAZINE) 25 MG tablet One tablet daily for 2 weeks, then take one tablet twice a day Patient taking differently: Take 25 mg by mouth 2 (two) times daily.  09/21/17   York SpanielWillis, Charles K, MD    Family History No family history on file.  Social History Social History   Tobacco Use  . Smoking status: Never Smoker  . Smokeless tobacco: Never Used  Substance Use Topics  . Alcohol use: Never    Frequency: Never  . Drug use: Never     Allergies   Patient has no known allergies.   Review of Systems Review of Systems  Unable to perform ROS: Dementia  Skin: Positive for rash.     Physical  Exam Updated Vital Signs BP (!) 152/78 (BP Location: Left Arm) Comment: pt constantly moving   Pulse (!) 125 Comment: pt constantly moving   Temp 98.4 F (36.9 C) (Oral)   Resp (!) 22   SpO2 100%   Physical Exam  Constitutional: He appears well-developed.  Cachectic  HENT:  Head: Normocephalic.  Eyes: Conjunctivae are normal.  Neck: No tracheal deviation present.  Cardiovascular:  Mildly rapid rate  Pulmonary/Chest: Breath sounds normal.  Abdominal: There is no tenderness.  Musculoskeletal: Normal range of motion.  Neurological:  Patient alert but does not talk.  Patient is moving constantly because of the Huntington  Skin:  Patient has a rash all over his buttocks.      ED Treatments / Results  Labs (all labs ordered are listed, but only abnormal results are displayed) Labs Reviewed - No data to display  EKG None  Radiology No results found.  Procedures Procedures (including critical care time)  Medications Ordered in ED Medications - No data to display   Initial Impression / Assessment and Plan / ED Course  I have reviewed the triage vital signs and the nursing notes.  Pertinent labs & imaging results that were available during my care of the patient were reviewed by me and considered in my medical decision making (see chart for details).     Patient is a rash to buttocks.  I suspect it is related to wearing a diaper that is not changed frequently and constantly moving and moving because of the Huntington.  Patient will be treated with hydrocortisone lotion and frequent changing her diaper  Final Clinical Impressions(s) / ED Diagnoses   Final diagnoses:  Rash    ED Discharge Orders        Ordered    hydrocortisone 2.5 % lotion     11/17/17 0849       Bethann Berkshire, MD 11/17/17 [phone]

## 2017-11-17 NOTE — ED Notes (Signed)
[facility] notified of transport needed back to [facility].

## 2017-11-17 NOTE — ED Notes (Signed)
ED Provider at bedside. 

## 2017-11-17 NOTE — ED Triage Notes (Signed)
Per [facility] pt from [facility] for staff noticing blood in his sheets this morning coming from pressure sores on his buttocks. Per EMS bleeding controled when they arrived on scene. Pt has Huntingson's Disease and has involuntary muscle spasms and hard to get to stay still without injuring or falling off stretcher. Pt A&O x2.  Pt here 2 days ago for fall. Patient has multiple bruising noted all over legs in different staging colors and abrasion above left eye from fall 2 days ago.

## 2017-11-17 NOTE — ED Notes (Signed)
Bed: [id number] Expected date:  Expected time:  Means of arrival:  Comments: 62 yo m pressure ulcers

## 2017-12-08 DEATH — deceased

## 2018-01-26 ENCOUNTER — Ambulatory Visit: Payer: Medicare Other | Admitting: Neurology

## 2019-06-20 IMAGING — CR DG WRIST 2V*R*
2 series · 2 of 2 positions shown · non-contrast
Comparison: None.

CLINICAL DATA: 62 y/o  M; status post fall with wrist pain.

EXAM:
RIGHT WRIST - 2 VIEW

[x wrist obl right]
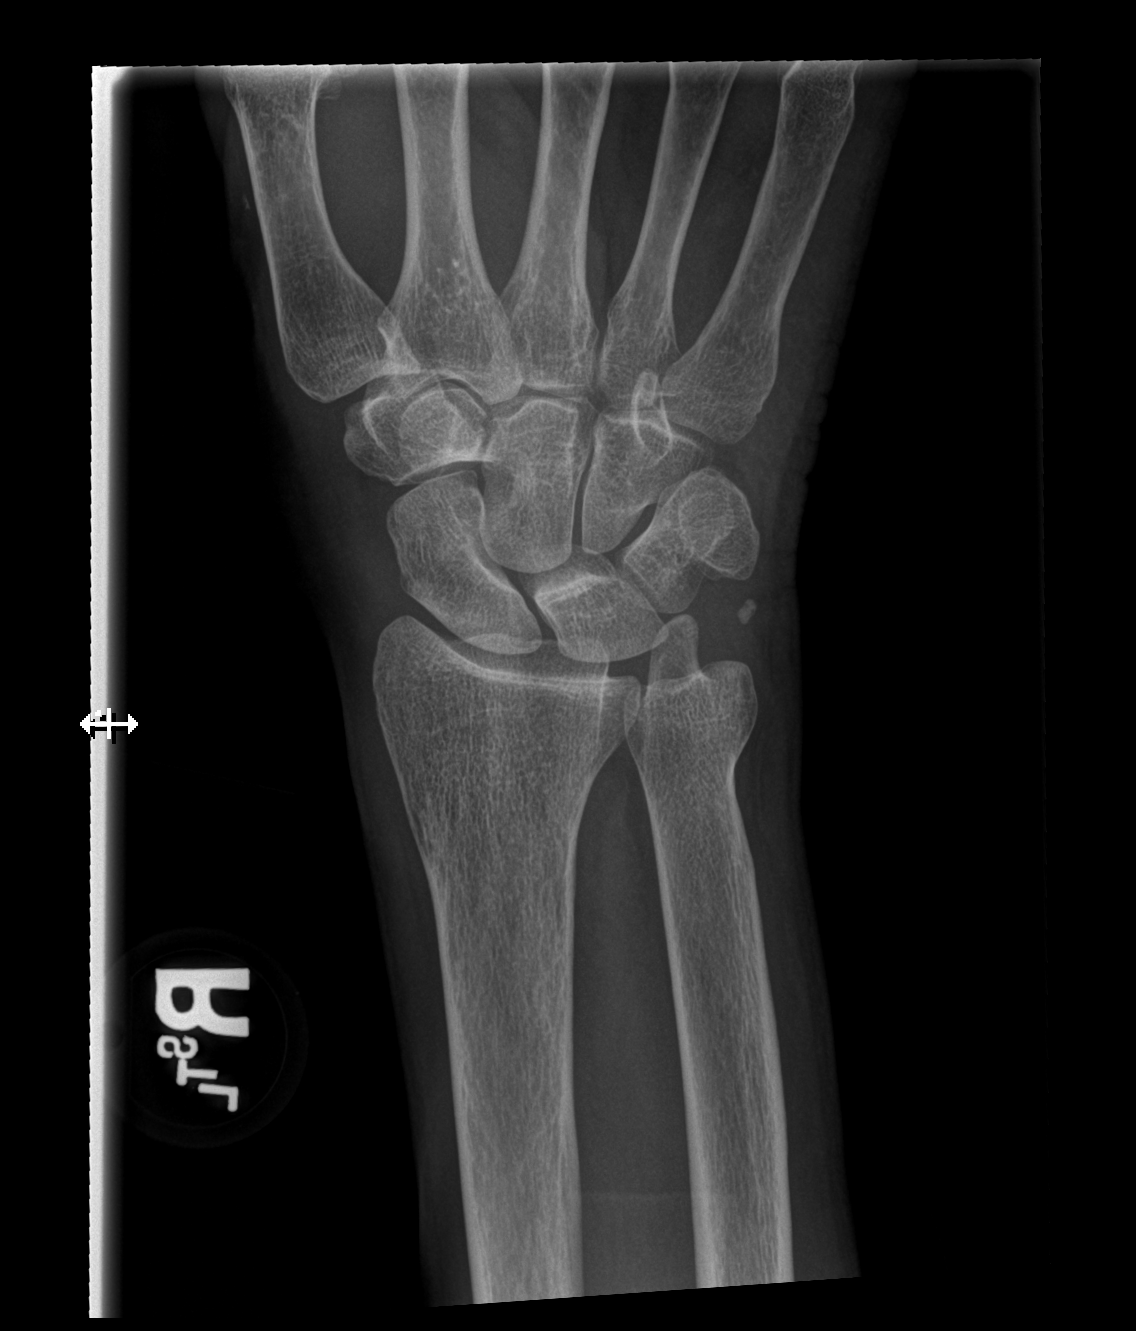

[x wrist lat right]
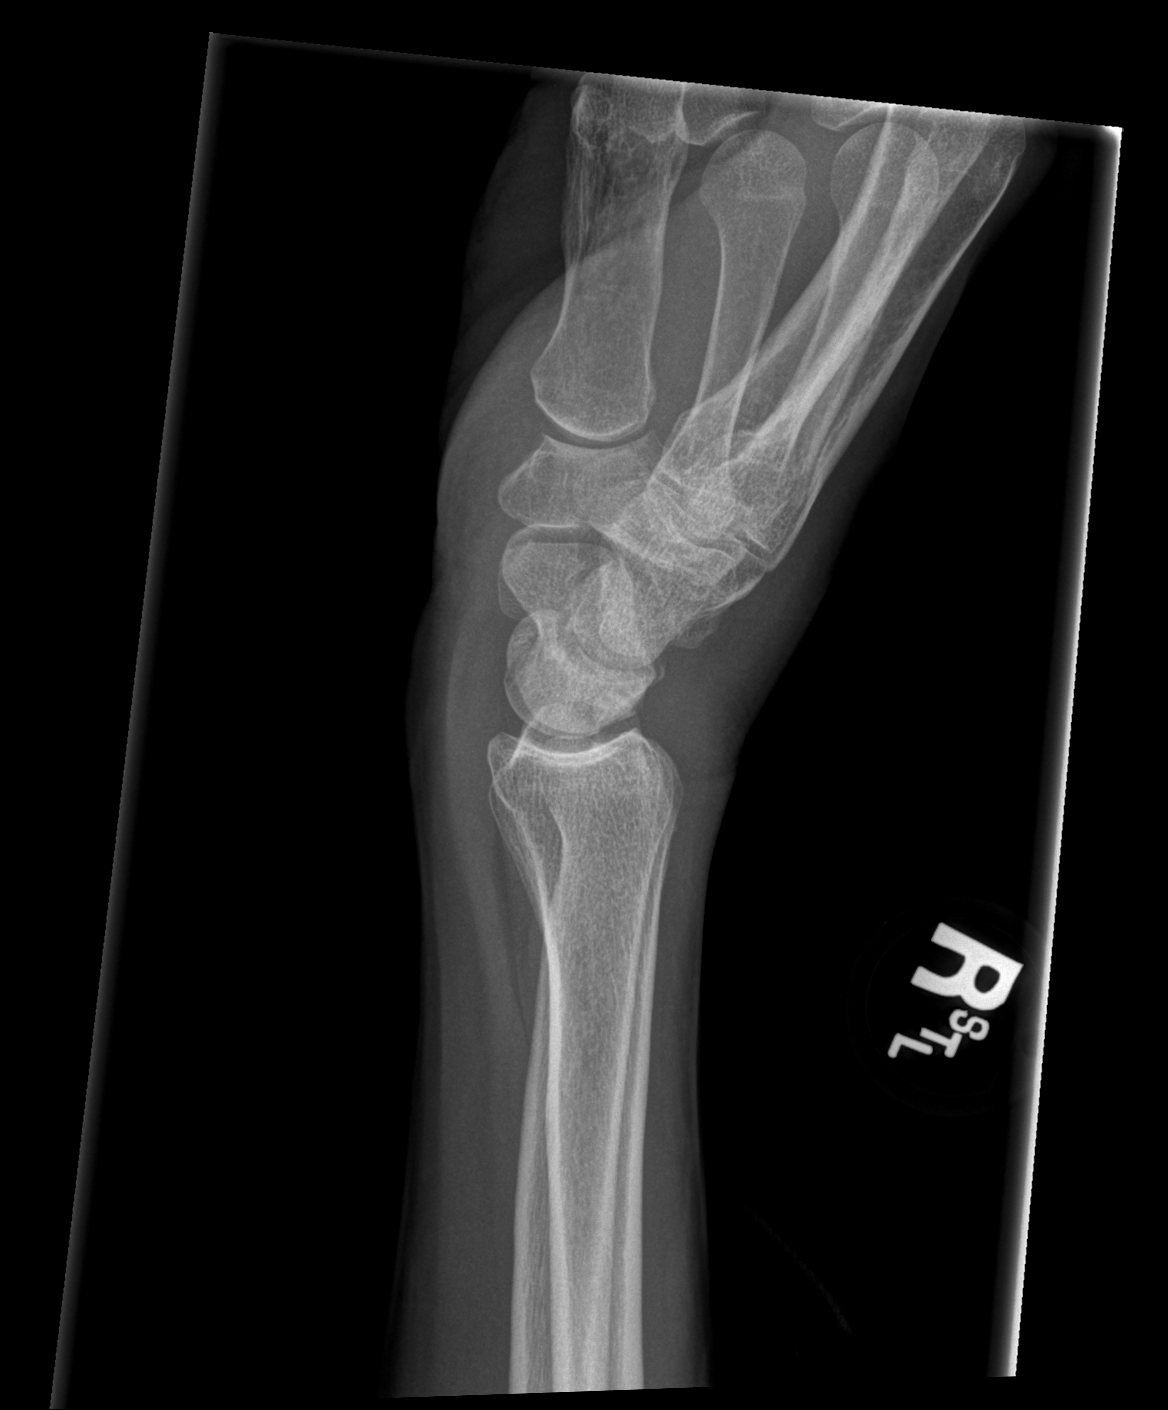

[2 of 2 positions shown; findings below may reference images not displayed]

FINDINGS: There is no evidence of fracture or dislocation. There is no
evidence of arthropathy or other focal bone abnormality. Small bony
density projects medial to the wrist joint, possibly sequelae of
remote fracture.
IMPRESSION: No acute fracture or dislocation identified.

By: Tamika Coop M.D.

## 2019-10-26 IMAGING — CT CT CERVICAL SPINE W/O CM
2 of 11 series · 8 of 33 positions shown, 9 images · non-contrast
Comparison: None.

CLINICAL DATA: Found down, head and neck pain.

EXAM:
CT HEAD WITHOUT CONTRAST
CT CERVICAL SPINE WITHOUT CONTRAST
TECHNIQUE: Multidetector CT imaging of the head and cervical spine was
performed following the standard protocol without intravenous
contrast. Multiplanar CT image reconstructions of the cervical spine
were also generated.

[Series 14: c spine soft · axial · 0.37mm/px · z∈[-349,-249]mm · 3 of 100 slices shown, 4 images]
[im 25/100  soft-tissue]
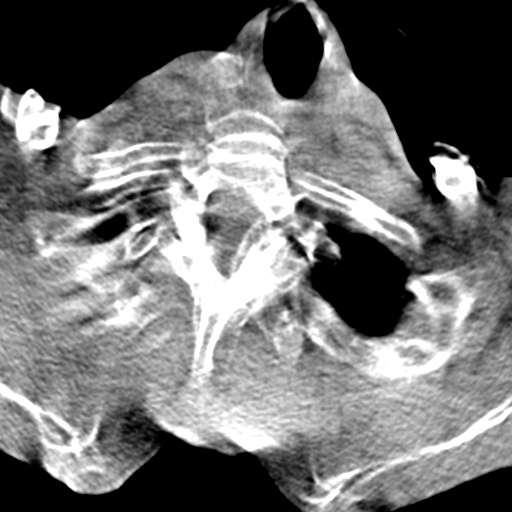
[im 25/100  bone]
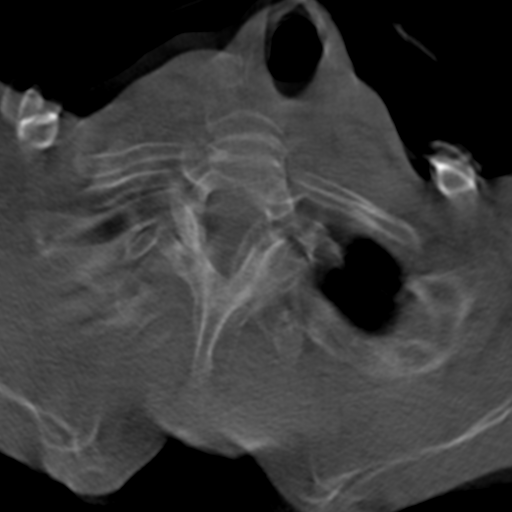
[im 50/100  bone]
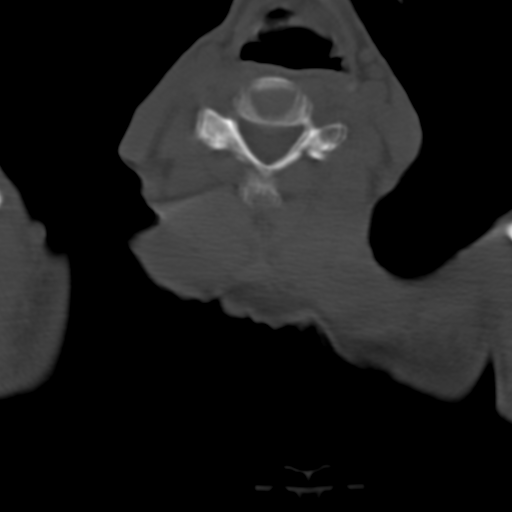
[im 75/100  bone]
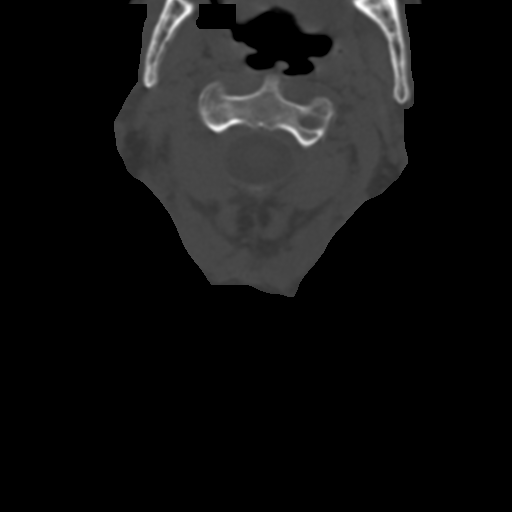

[Series 18: sagittal bone · sagittal · 0.40mm/px · 5 of 65 slices shown]
[im 11/65  bone]
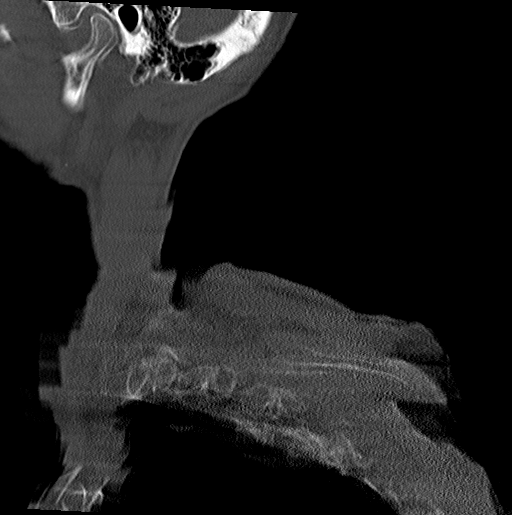
[im 22/65  bone]
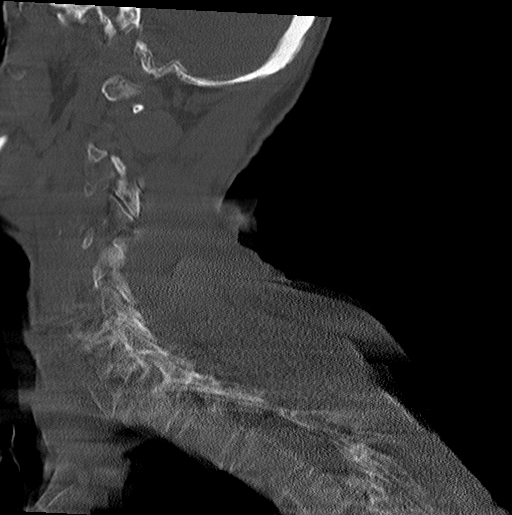
[im 33/65  bone]
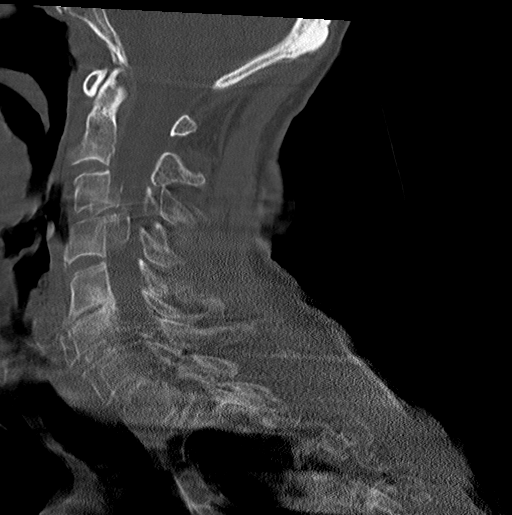
[im 43/65  bone]
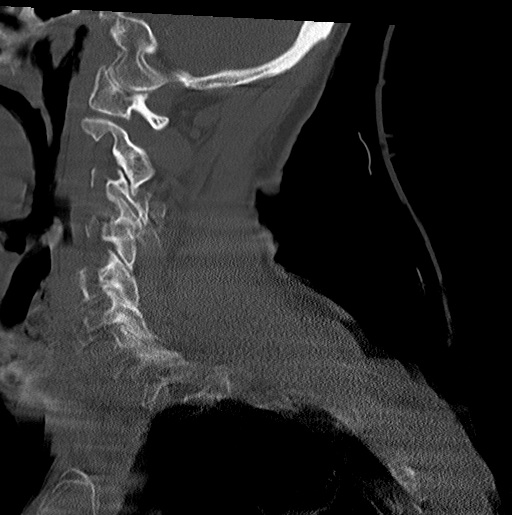
[im 54/65  bone]
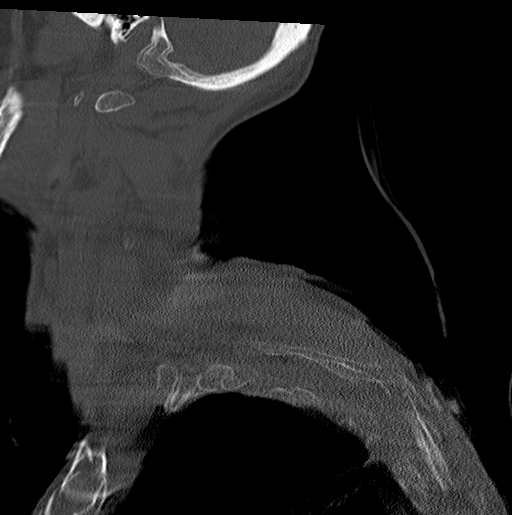

[8 of 33 positions shown; findings below may reference images not displayed]

FINDINGS: CT HEAD FINDINGS

Brain: Study is significantly limited by patient motion artifact,
despite multiple imaging attempts. Ventricles appear to be within
normal limits in size and configuration. There is no obvious mass,
hemorrhage, edema or other evidence of acute parenchymal
abnormality. No extra-axial hemorrhage seen.

Vascular: There are chronic calcified atherosclerotic changes of the
large vessels at the skull base. No unexpected hyperdense vessel.

Skull: No obvious fracture displacement.

Sinuses/Orbits: No acute finding.

Other: None.

CT CERVICAL SPINE FINDINGS

Cervical spine CT is also significantly limited by patient motion
artifact.

Alignment: No obvious evidence of acute vertebral body subluxation.

Skull base and vertebrae: No obvious fracture line or displaced
fracture fragment.

Soft tissues and spinal canal: Unremarkable.

Disc levels: No evidence of significant central canal stenosis at
any level, although characterization at of the mid and lower levels
is significantly limited by the motion artifact.

Upper chest: Presumed large emphysematous bleb within the RIGHT lung
apex, but difficult to characterize due to the extensive patient
motion artifact.

Other: None.
IMPRESSION: 1. Head CT and cervical spine CT are both significantly limited by
patient motion artifact (related to patient's Huntington's disease
and associated involuntary movements).
2. No obvious evidence of acute intracranial abnormality. No
intracranial hemorrhage seen. No skull fracture seen.
3. No obvious fracture or dislocation within the cervical spine.
4. Presumed large emphysematous bleb within the RIGHT lung apex, but
characterization again limited by the patient motion.
